# Patient Record
Sex: Male | Born: 1960
Health system: Southern US, Community
[De-identification: ages and names within clinical notes are randomized; demographics above are authoritative.]

## PROBLEM LIST (undated history)

## (undated) DIAGNOSIS — F419 Anxiety disorder, unspecified: Secondary | ICD-10-CM

## (undated) DIAGNOSIS — K219 Gastro-esophageal reflux disease without esophagitis: Secondary | ICD-10-CM

## (undated) DIAGNOSIS — J189 Pneumonia, unspecified organism: Secondary | ICD-10-CM

## (undated) DIAGNOSIS — T8859XA Other complications of anesthesia, initial encounter: Secondary | ICD-10-CM

## (undated) DIAGNOSIS — T4145XA Adverse effect of unspecified anesthetic, initial encounter: Secondary | ICD-10-CM

## (undated) DIAGNOSIS — M1990 Unspecified osteoarthritis, unspecified site: Secondary | ICD-10-CM

## (undated) HISTORY — PX: HERNIA REPAIR: SHX51

## (undated) HISTORY — PX: COLONOSCOPY: SHX174

## (undated) HISTORY — PX: NOSE SURGERY: SHX723

---

## 1998-05-18 ENCOUNTER — Ambulatory Visit: Admission: RE | Admit: 1998-05-18 | Discharge: 1998-05-18 | Payer: Self-pay | Admitting: Internal Medicine

## 2011-04-26 ENCOUNTER — Emergency Department (HOSPITAL_BASED_OUTPATIENT_CLINIC_OR_DEPARTMENT_OTHER)
Admission: EM | Admit: 2011-04-26 | Discharge: 2011-04-27 | Disposition: A | Discharge status: Home / Self Care | Payer: BC Managed Care – PPO | Attending: Emergency Medicine | Admitting: Emergency Medicine

## 2011-04-26 ENCOUNTER — Emergency Department (INDEPENDENT_AMBULATORY_CARE_PROVIDER_SITE_OTHER): Payer: BC Managed Care – PPO

## 2011-04-26 DIAGNOSIS — J189 Pneumonia, unspecified organism: Secondary | ICD-10-CM | POA: Insufficient documentation

## 2011-04-26 DIAGNOSIS — R509 Fever, unspecified: Secondary | ICD-10-CM

## 2011-04-27 LAB — DIFFERENTIAL
Basophils Absolute: 0 10*3/uL (ref 0.0–0.1)
Basophils Relative: 0 % (ref 0–1)
Eosinophils Absolute: 0.2 10*3/uL (ref 0.0–0.7)
Eosinophils Relative: 3 % (ref 0–5)
Monocytes Absolute: 0.8 10*3/uL (ref 0.1–1.0)
Monocytes Relative: 13 % — ABNORMAL HIGH (ref 3–12)

## 2011-04-27 LAB — BASIC METABOLIC PANEL
Calcium: 9.1 mg/dL (ref 8.4–10.5)
GFR calc Af Amer: >60 mL/min (ref >60)
GFR calc non Af Amer: >60 mL/min (ref >60)
Glucose, Bld: 120 mg/dL — ABNORMAL HIGH (ref 70–99)
Sodium: 133 mEq/L — ABNORMAL LOW (ref 135–145)

## 2011-04-27 LAB — CBC
MCH: 31.8 pg (ref 26.0–34.0)
MCHC: 35.4 g/dL (ref 30.0–36.0)
Platelets: 162 10*3/uL (ref 150–400)
RDW: 11.9 % (ref 11.5–15.5)

## 2011-04-28 ENCOUNTER — Emergency Department (INDEPENDENT_AMBULATORY_CARE_PROVIDER_SITE_OTHER): Payer: BC Managed Care – PPO

## 2011-04-28 ENCOUNTER — Emergency Department (HOSPITAL_BASED_OUTPATIENT_CLINIC_OR_DEPARTMENT_OTHER)
Admission: EM | Admit: 2011-04-28 | Discharge: 2011-04-28 | Disposition: A | Discharge status: Other Acute Inpatient Hospital | Payer: BC Managed Care – PPO | Source: Home / Self Care | Attending: Emergency Medicine | Admitting: Emergency Medicine

## 2011-04-28 ENCOUNTER — Inpatient Hospital Stay (HOSPITAL_COMMUNITY)
Admission: AD | Admit: 2011-04-28 | Discharge: 2011-04-30 | DRG: 090 | Disposition: A | Discharge status: Home / Self Care | Payer: BC Managed Care – PPO | Source: Other Acute Inpatient Hospital | Attending: Internal Medicine | Admitting: Internal Medicine

## 2011-04-28 DIAGNOSIS — J189 Pneumonia, unspecified organism: Secondary | ICD-10-CM

## 2011-04-28 DIAGNOSIS — R7309 Other abnormal glucose: Secondary | ICD-10-CM | POA: Diagnosis present

## 2011-04-28 DIAGNOSIS — R509 Fever, unspecified: Secondary | ICD-10-CM

## 2011-04-28 DIAGNOSIS — R05 Cough: Secondary | ICD-10-CM

## 2011-04-28 LAB — COMPREHENSIVE METABOLIC PANEL
Albumin: 2.5 g/dL — ABNORMAL LOW (ref 3.5–5.2)
BUN: 9 mg/dL (ref 6–23)
Creatinine, Ser: 1.05 mg/dL (ref 0.50–1.35)
Total Protein: 6.1 g/dL (ref 6.0–8.3)

## 2011-04-28 LAB — DIFFERENTIAL
Basophils Absolute: 0 10*3/uL (ref 0.0–0.1)
Eosinophils Absolute: 0.1 10*3/uL (ref 0.0–0.7)
Lymphocytes Relative: 12 % (ref 12–46)
Neutro Abs: 5 10*3/uL (ref 1.7–7.7)

## 2011-04-28 LAB — BASIC METABOLIC PANEL
CO2: 26 mEq/L (ref 19–32)
Calcium: 9.2 mg/dL (ref 8.4–10.5)
GFR calc non Af Amer: >60 mL/min (ref >60)
Sodium: 134 mEq/L — ABNORMAL LOW (ref 135–145)

## 2011-04-28 LAB — CBC
MCH: 31.7 pg (ref 26.0–34.0)
MCHC: 35.4 g/dL (ref 30.0–36.0)
Platelets: 201 10*3/uL (ref 150–400)
RBC: 4.23 MIL/uL (ref 4.22–5.81)

## 2011-04-28 LAB — BLOOD GAS, ARTERIAL
Drawn by: 308601
FIO2: 0.21 %
O2 Saturation: 92.9 %
Patient temperature: 98.6

## 2011-04-28 LAB — PHOSPHORUS: Phosphorus: 2.8 mg/dL (ref 2.3–4.6)

## 2011-04-28 LAB — MAGNESIUM: Magnesium: 2.1 mg/dL (ref 1.5–2.5)

## 2011-04-29 LAB — BASIC METABOLIC PANEL
Chloride: 101 mEq/L (ref 96–112)
Creatinine, Ser: 1.09 mg/dL (ref 0.50–1.35)
GFR calc Af Amer: >60 mL/min (ref >60)
Potassium: 3.7 mEq/L (ref 3.5–5.1)

## 2011-04-29 LAB — HIV ANTIBODY (ROUTINE TESTING W REFLEX): HIV: NONREACTIVE

## 2011-04-29 LAB — CBC
MCV: 90.5 fL (ref 78.0–100.0)
Platelets: 209 10*3/uL (ref 150–400)
RDW: 12.6 % (ref 11.5–15.5)
WBC: 6.1 10*3/uL (ref 4.0–10.5)

## 2011-04-29 LAB — HEMOGLOBIN A1C: Mean Plasma Glucose: 111 mg/dL (ref <117)

## 2011-04-30 LAB — BASIC METABOLIC PANEL
GFR calc Af Amer: >60 mL/min (ref >60)
GFR calc non Af Amer: >60 mL/min (ref >60)
Potassium: 3.9 mEq/L (ref 3.5–5.1)
Sodium: 136 mEq/L (ref 135–145)

## 2011-04-30 LAB — CBC
Hemoglobin: 13.3 g/dL (ref 13.0–17.0)
MCHC: 34.8 g/dL (ref 30.0–36.0)
Platelets: 256 10*3/uL (ref 150–400)
RDW: 12.9 % (ref 11.5–15.5)

## 2011-04-30 LAB — DIFFERENTIAL
Basophils Absolute: 0.1 10*3/uL (ref 0.0–0.1)
Eosinophils Absolute: 0.3 10*3/uL (ref 0.0–0.7)
Lymphs Abs: 1.2 10*3/uL (ref 0.7–4.0)
Monocytes Absolute: 0.8 10*3/uL (ref 0.1–1.0)
Monocytes Relative: 14 % — ABNORMAL HIGH (ref 3–12)

## 2011-05-24 NOTE — Discharge Summary (Signed)
NAME:  [name] [place], [name] NO.:  [id number]  MEDICAL RECORD NO.:  [id number]  LOCATION:  1340                         FACILITY:  [facility]  PHYSICIAN:  Ramiro Harvest, MD    DATE OF BIRTH:  [birth year]  DATE OF ADMISSION:  04/28/2011 DATE OF DISCHARGE:  04/30/2011                        DISCHARGE SUMMARY    PRIMARY CARE PHYSICIAN:  Gretta Arab. Valentina Lucks, MD, of [facility].  DISCHARGE DIAGNOSIS:  Community-acquired pneumonia.  DISCHARGE MEDICATIONS: 1. Avelox 400 mg p.o. daily x4 days. 2. Mucinex 1200 mg p.o. b.i.d. x4 days. 3. Robitussin syrup 5 mL p.o. q.4 h p.r.n. 4. Albuterol inhaler 2 puffs every 4 hours as needed. 5. Motrin 400 mg p.o. q.8 h p.r.n. 6. Multivitamin 1 tablet p.o. daily. 7. Tylenol Extra Strength 500 mg 2 tablets p.o. q.4 h p.r.n. pain.  DISPOSITION AND FOLLOWUP:  The patient will be discharged home.  The patient is to follow up with his PCP in the next 1 to 2 weeks to follow up on his community-acquired pneumonia.  CONSULTATIONS:  None.  PROCEDURES PERFORMED:  A chest x-ray was done on April 28, 2011, that showed slight worsening of left lower lobe pneumonia.  BRIEF ADMISSION HISTORY AND PHYSICAL:  [name] Shepherd is a 50 year old Caucasian gentleman with no significant past medical history who presented to the hospital after 1 week of general malaise, cough, nonproductive, and a high-grade fever with temps up to 103 to 104.  The patient had an x-ray done at the [facility] that demonstrated left lower lobe infiltrate consistent with a pneumonia.  The patient was started on a Z-Pak on the Saturday prior to admission; his symptoms started around the Friday around mid day prior to admission.  Despite being on antibiotics, the patient's symptoms worsened.  He continued to progressively get worse.  The patient went to the [facility], another repeat chest x-ray which was done showed slight worsening of his left lower  lobe infiltrate, was found to be hypoxic on exertion with O2 saturations dropping to the mid 80s.  He was subsequently transferred for further evaluation and treatment.  The patient was febrile with a temperature of 103 and tachycardic with heart rate of 115.  For the rest of admission history and physical, please see H and P dictated by Dr. Gwenlyn Perking of job number [id number].  HOSPITAL COURSE:  Left lower lobe community-acquired pneumonia:  The patient was admitted with a left lower lobe community-acquired pneumonia.  Urine Legionella and pneumococcus antigen were checked; they were pending at the time of discharge.  Blood cultures were also checked, however, were also pending.  The patient was monitored.  The patient improved clinically during this hospitalization and ABG which was done had a pH of 7.46, pCO2 of 38 and pO2 of 61, bicarb level of 27. The patient was placed on Mucinex, oxygen, nebulizer treatments as needed, as well as IV Avelox.  HIV antibody which was tested was nonreactive.  The patient improved clinically.  His cough improved and the patient remained afebrile about 36 hours during this hospitalization.  The patient's Mucinex was increased to 1200 mg daily. The patient improved clinically and will be discharged in stable  and improved condition on 4 more days of oral antibiotics of Avelox to complete a 1-week course of antibiotic therapy.  The patient will be discharged in stable and improved condition with followup with PCP as stated above.  On day of discharge, vital signs:  Temperature 97.9, pulse of 62, respirations 16, blood pressure 134/77, saturating 97% on room air.  DISCHARGE LABS:  CBC:  White count 5.8, hemoglobin 13.3, hematocrit 38.2 and a platelet count of 256, with an ANC of 3.4.  BMET with a sodium of 136, potassium 3.9, chloride 100, bicarb 29, glucose 103, BUN 9, creatinine 1.03, and a calcium of 9.3.  It has been a pleasure taking care of [name].     Ramiro Harvest, MD     DT/MEDQ  D:  04/30/2011  T:  04/30/2011  Job:  [id number]  cc:   Gretta Arab. Valentina Lucks, M.D. Fax: [phone]  Electronically Signed by Ramiro Harvest MD on 05/24/2011 06:23:57 PM

## 2011-05-26 NOTE — H&P (Signed)
NAME:  [name] [place], [name] Shepherd NO.:  [id number]  MEDICAL RECORD NO.:  [id number]  LOCATION:  1340                         FACILITY:  [facility]  PHYSICIAN:  Rosanna Randy, MDDATE OF BIRTH:  02-03-1961  DATE OF ADMISSION:  04/28/2011 DATE OF DISCHARGE:                             HISTORY & PHYSICAL   PRIMARY CARE PHYSICIAN:  Gretta Arab. Valentina Lucks, MD, with [facility].  CHIEF COMPLAINT:  Cough, chest discomfort, high-grade fever, general malaise.  HISTORY OF PRESENT ILLNESS:  [name] [place] is a 50 year old male without past medical history who came into the hospital after 1 week of general malaise, cough nonproductive, and high-grade fever.  He actually had his fever measured at home with values up to 103-104.  The patient had an x- ray done initially at [facility] that demonstrated left lower lobe infiltrate consistent with pneumonia and was started on Z-PAK. This was on Saturday.  Symptoms started around Friday, around midday. Despite being on the antibiotic, the patient's symptoms continued to progress and in fact continued to worsen.  The patient went to the [facility] and another chest x-ray demonstrated slight worsening of his left lower lobe infiltrate and was also found to be hypoxic on exertion with oxygen saturation dropping to the mid 80s.  At that moment, he was transferred for further evaluation and treatment.  The patient was also febrile with a temperature of 103 and tachycardic with a heart rate of 115.  ALLERGIES:  There is no known drug allergy.  PAST MEDICAL HISTORY:  There is no past medical history of importance.  MEDICATIONS:  Multivitamins over-the-counter.  SOCIAL HISTORY:  The patient denies tobacco and also illicit drugs.  He reports to drink 1 to 2 beers almost every day after work.  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  He is married, lives here in [name], has 2 children,  both of them are healthy.  FAMILY HISTORY:  Father with COPD and emphysema secondary to tobacco abuse.  Mother healthy.  REVIEW OF SYSTEMS:  Negative except as mentioned in HPI, otherwise.  PHYSICAL EXAMINATION:  VITAL SIGNS:  Temperature 98.6, heart rate 82, respiratory rate 20, blood pressure 129/72, oxygen saturation 95% on room air. GENERAL:  In general, the patient was lying in bed, coughing throughout the whole interview, warm to touch with his mucous membrane demonstrating mild dryness.  Due to the persistent cough, he was in mild distress, in general. HEENT:  Normocephalic.  No trauma.  Eyes:  Extraocular muscles intact. PERRLA.  No icterus and no nystagmus.  Ears and nose without any discharges.  No nasal congestion appreciated.  Mouth, no erythema or exudate.  Mild dry mucous membranes. NECK:  Supple.  No thyromegaly.  No bruits. RESPIRATORY SYSTEM:  Diffuse rhonchi.  Good air movement.  No wheezes. CARDIOVASCULAR:  Regular rate and rhythm.  No murmurs, gallops or rubs. ABDOMEN:  Soft, nontender, nondistended.  Positive bowel sounds. EXTREMITIES:  No cyanosis, clubbing or edema. SKIN:  No rashes or petechiae. NEUROLOGIC EXAM:  Alert, awake and oriented x3.  Cranial nerves II through XII intact.  Muscle strength 5/5.  Sensation intact to light  touch and pinprick. PSYCH:  Mood appropriate.  LABORATORY DATA:  Demonstrated a CBC with differential of 6.9, hemoglobin 13.4, platelets 201, MCV 89.4.  BMET with a sodium of 134, potassium 3.9, chloride 98, bicarb 26, blood sugar 129, BUN 9, creatinine 1.0, calcium 9.2.  X-ray demonstrated a slight worsening of the left lower lobe infiltrate in comparison to chest x-ray done on April 26, 2011.  ASSESSMENT AND PLAN.: 1. Fever, cough and acute respiratory distress with hypoxia on     exertion all most likely secondary to community-acquired pneumonia     due to the findings on his x-ray.  At this moment, we are going to     start  the patient on IV antibiotics using Avelox 400 mg every 24     hours.  We are going to check strep pneumo antigen in urine.  We     will also check a Legionella antigen in urine and due to the     hypoxia we are going to check an ABG; blood cultures, if the     patient spikes another fever.  Other concerns to explain for his     symptoms might be flu but at this moment he is out of the     therapeutic window since it has been more than 72 hours that he has     had the symptoms present.  So, we will hold on giving Tamiflu at     this moment.  We are going to provide fluid resuscitation and we     will go ahead and check HIV test.  We are going to use  Robitussin     and Mucinex to control his cough and we are going to use Tylenol     for fever control. 2. Hyperglycemia.  There is no history of diabetes.  We are going to     repeat another fasting blood sugar in the morning and we are going     to check a hemoglobin A1c. 3. DVT prophylaxis.  The patient is going to be started on Lovenox 40     mg subcutaneously q.24 h.  Further workup is going to be determined     on the patient's evaluation and results of these studies.     Rosanna Randy, MD     CEM/MEDQ  D:  04/28/2011  T:  04/28/2011  Job:  [id number]  cc:   Gretta Arab. Valentina Lucks, M.D. Fax: [phone]  Electronically Signed by Vassie Loll MD on 05/26/2011 04:20:55 PM

## 2012-08-20 IMAGING — CR DG CHEST 2V
2 series · 2 of 2 positions shown · non-contrast
Comparison: None

CLINICAL DATA: Fever over 101

CHEST - 2 VIEW

[w chest pa]
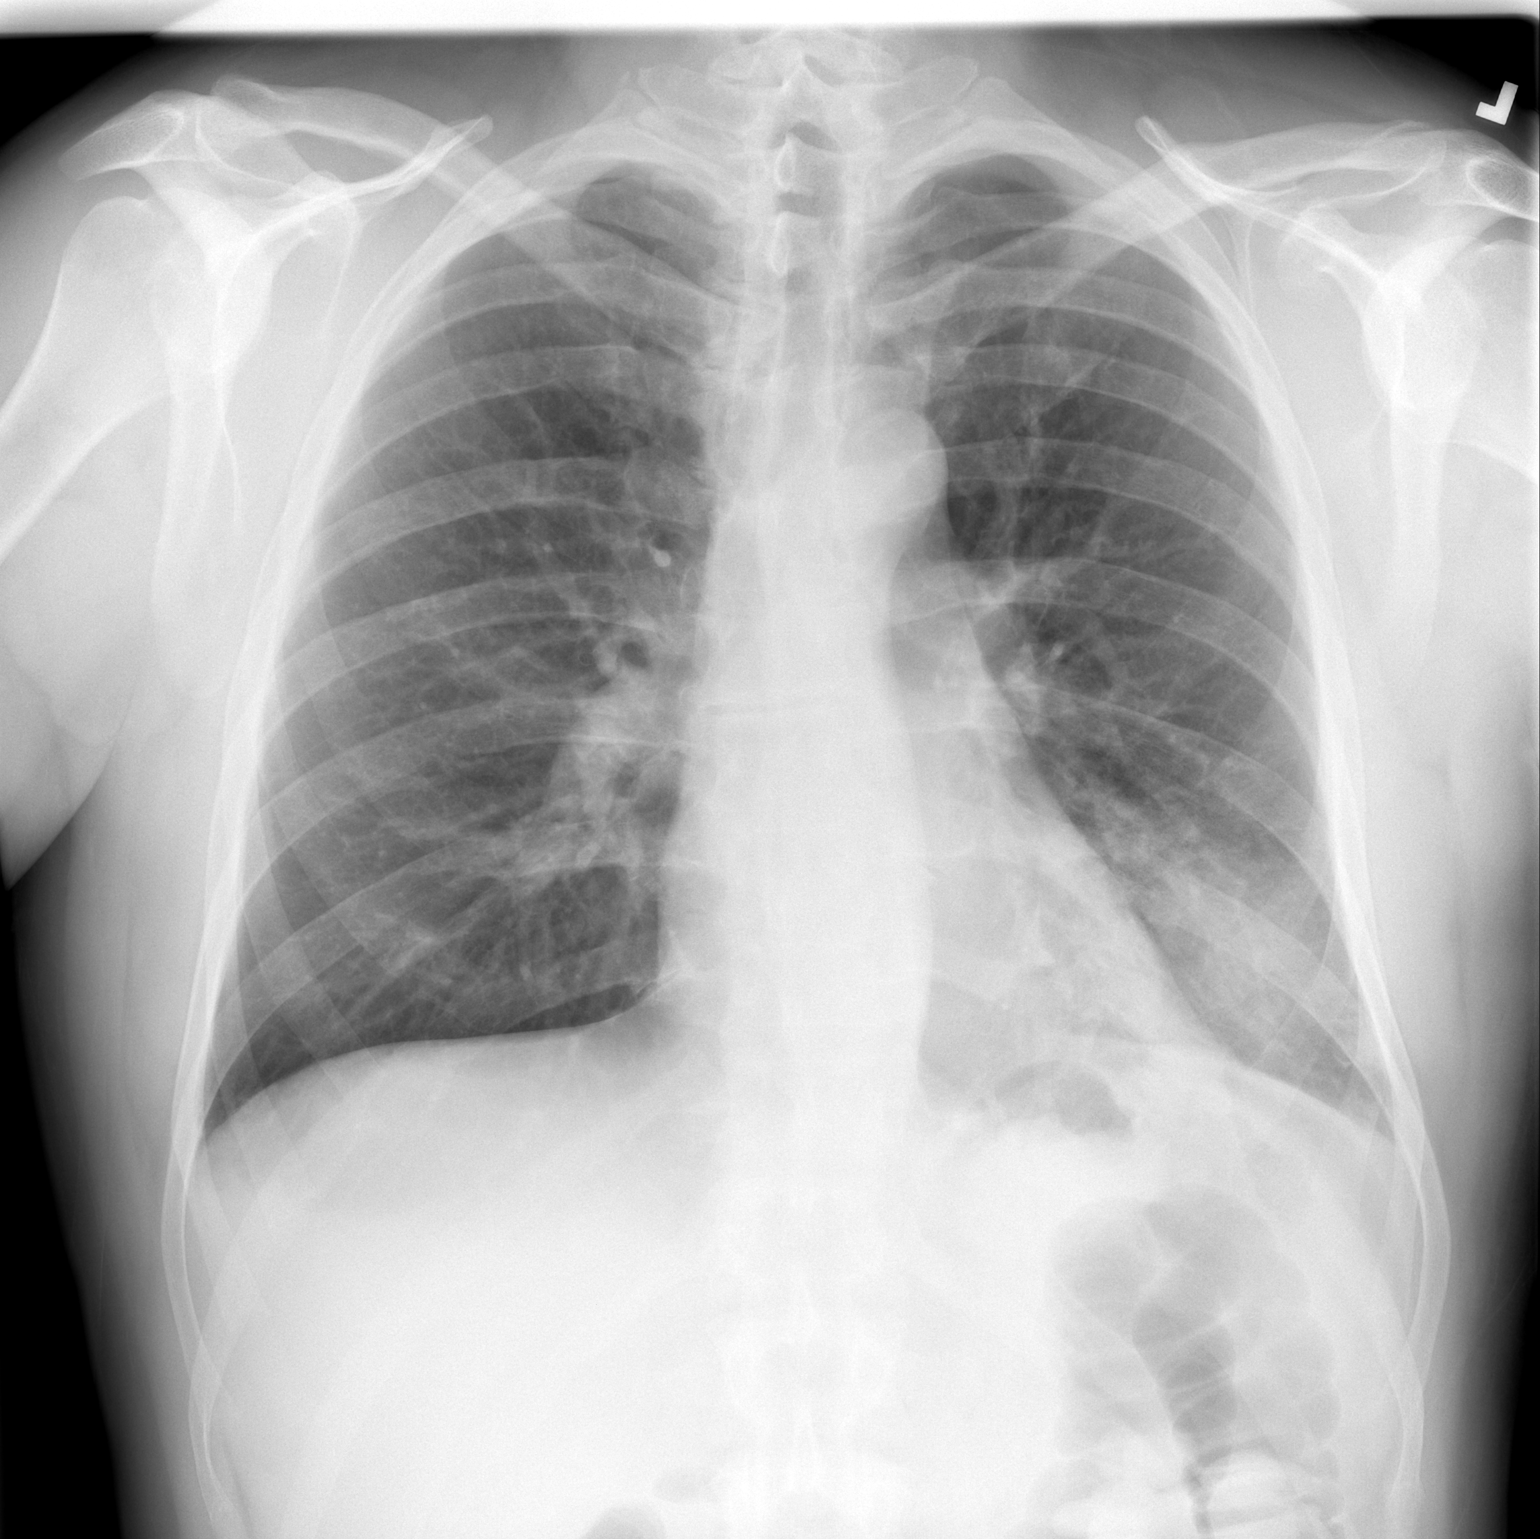

[w chest lat]
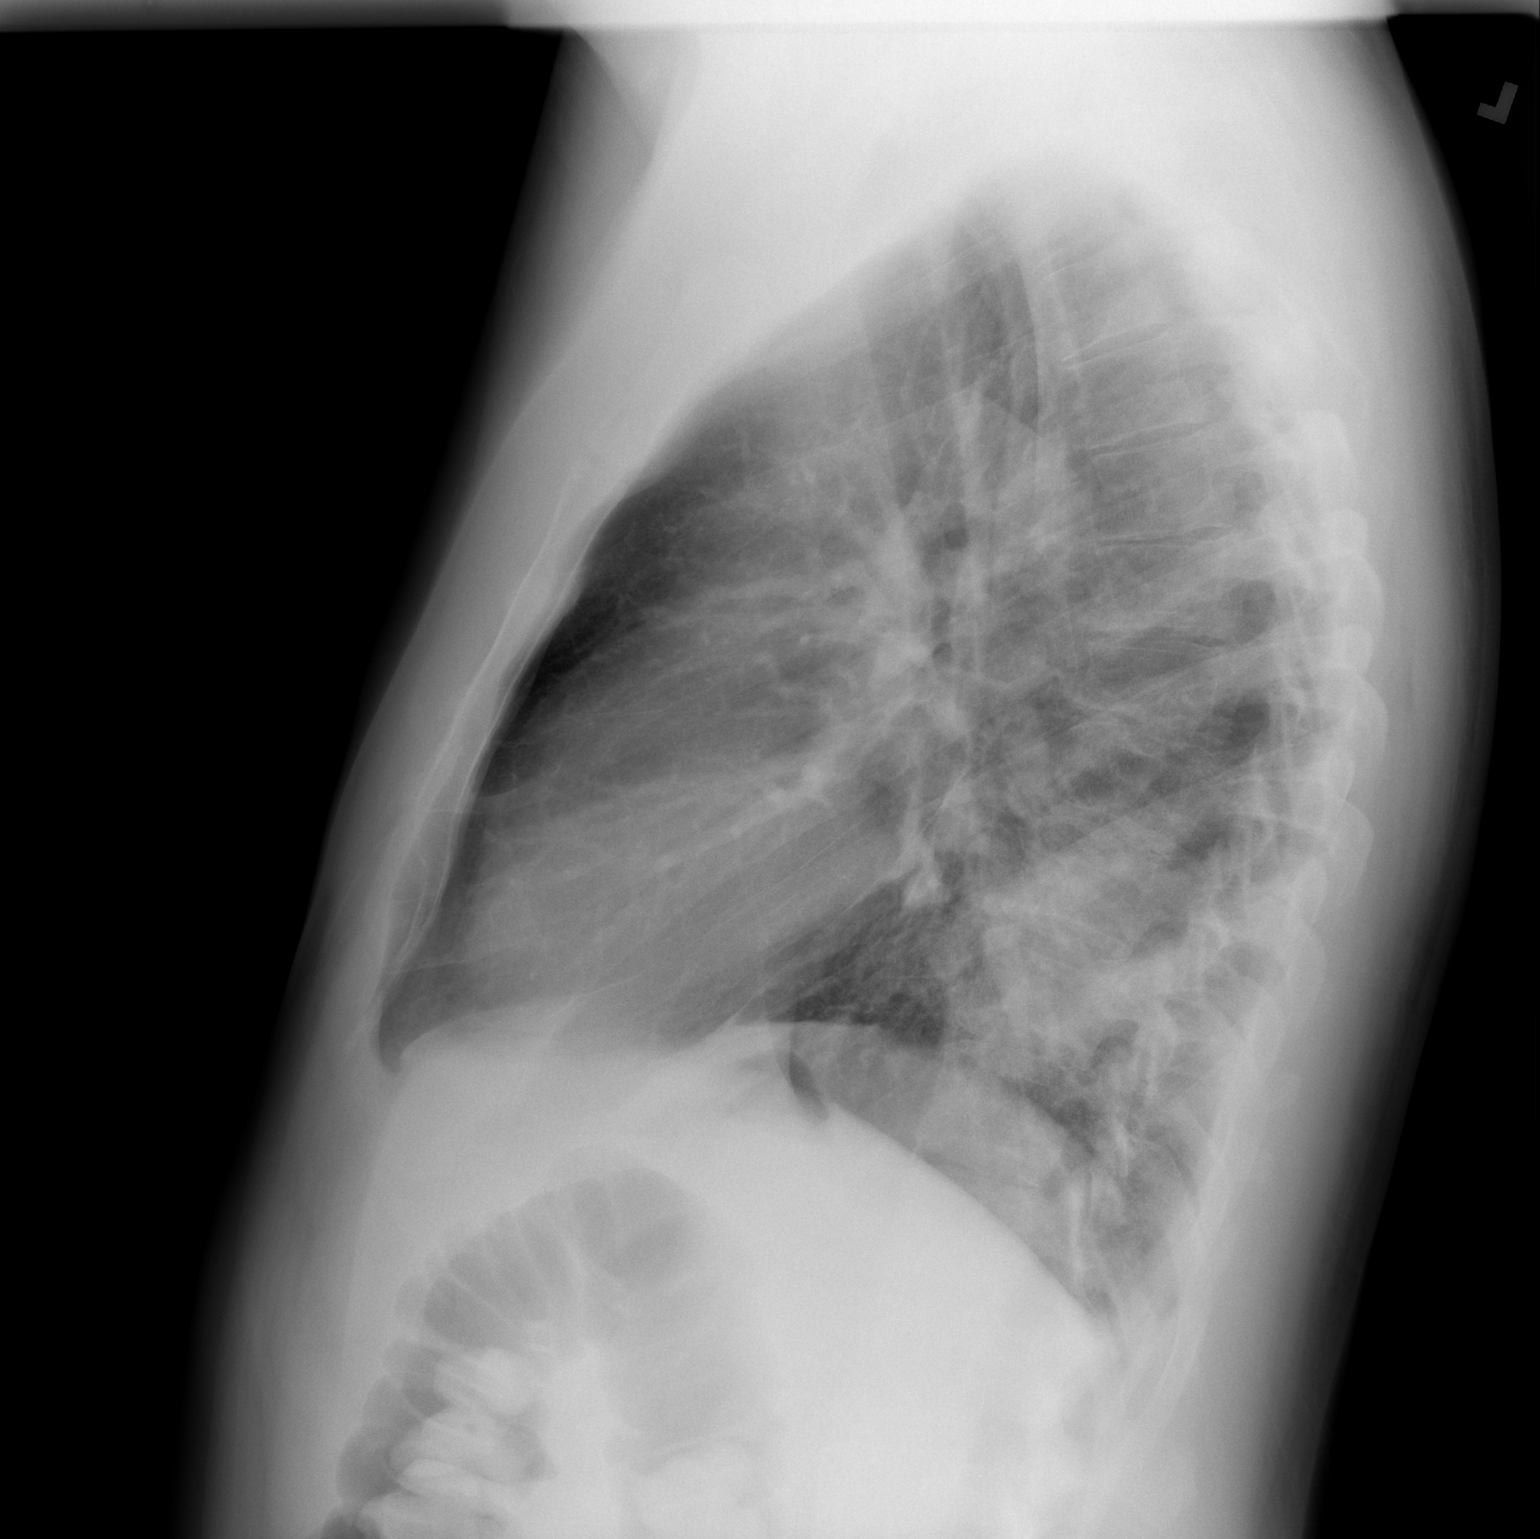

[2 of 2 positions shown; findings below may reference images not displayed]

FINDINGS: Heart size is normal.

No pleural effusion or pulmonary edema.

Airspace consolidation involving the left lower lobe is identified
compatible with pneumonia.
IMPRESSION: 1.  Left lower lobe pneumonia.

## 2012-08-22 IMAGING — CR DG CHEST 2V
2 series · 2 of 2 positions shown · non-contrast
Comparison: Chest x-ray of 04/26/2011

CLINICAL DATA: Cough, congestion, high fever

CHEST - 2 VIEW

[w chest pa]
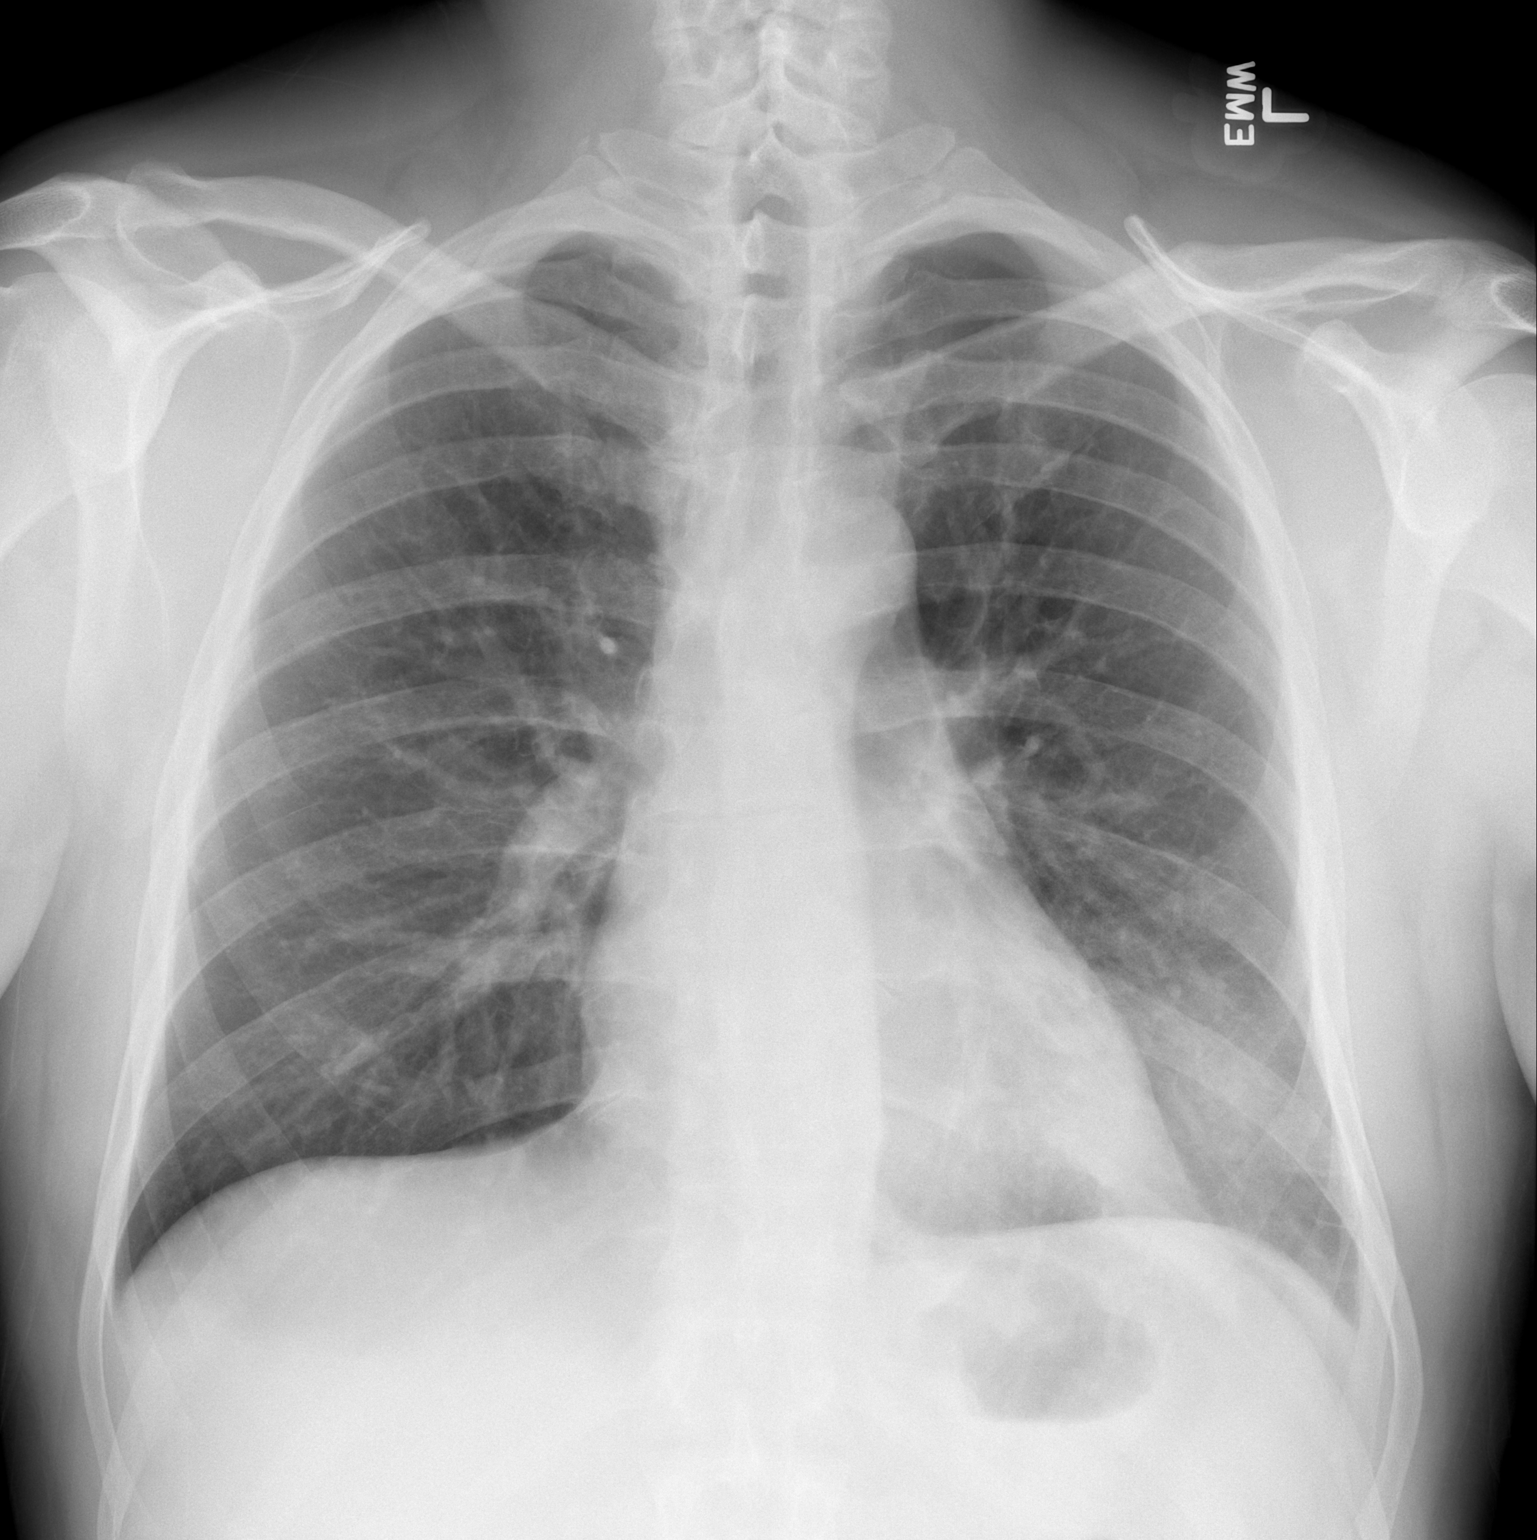

[w chest lat]
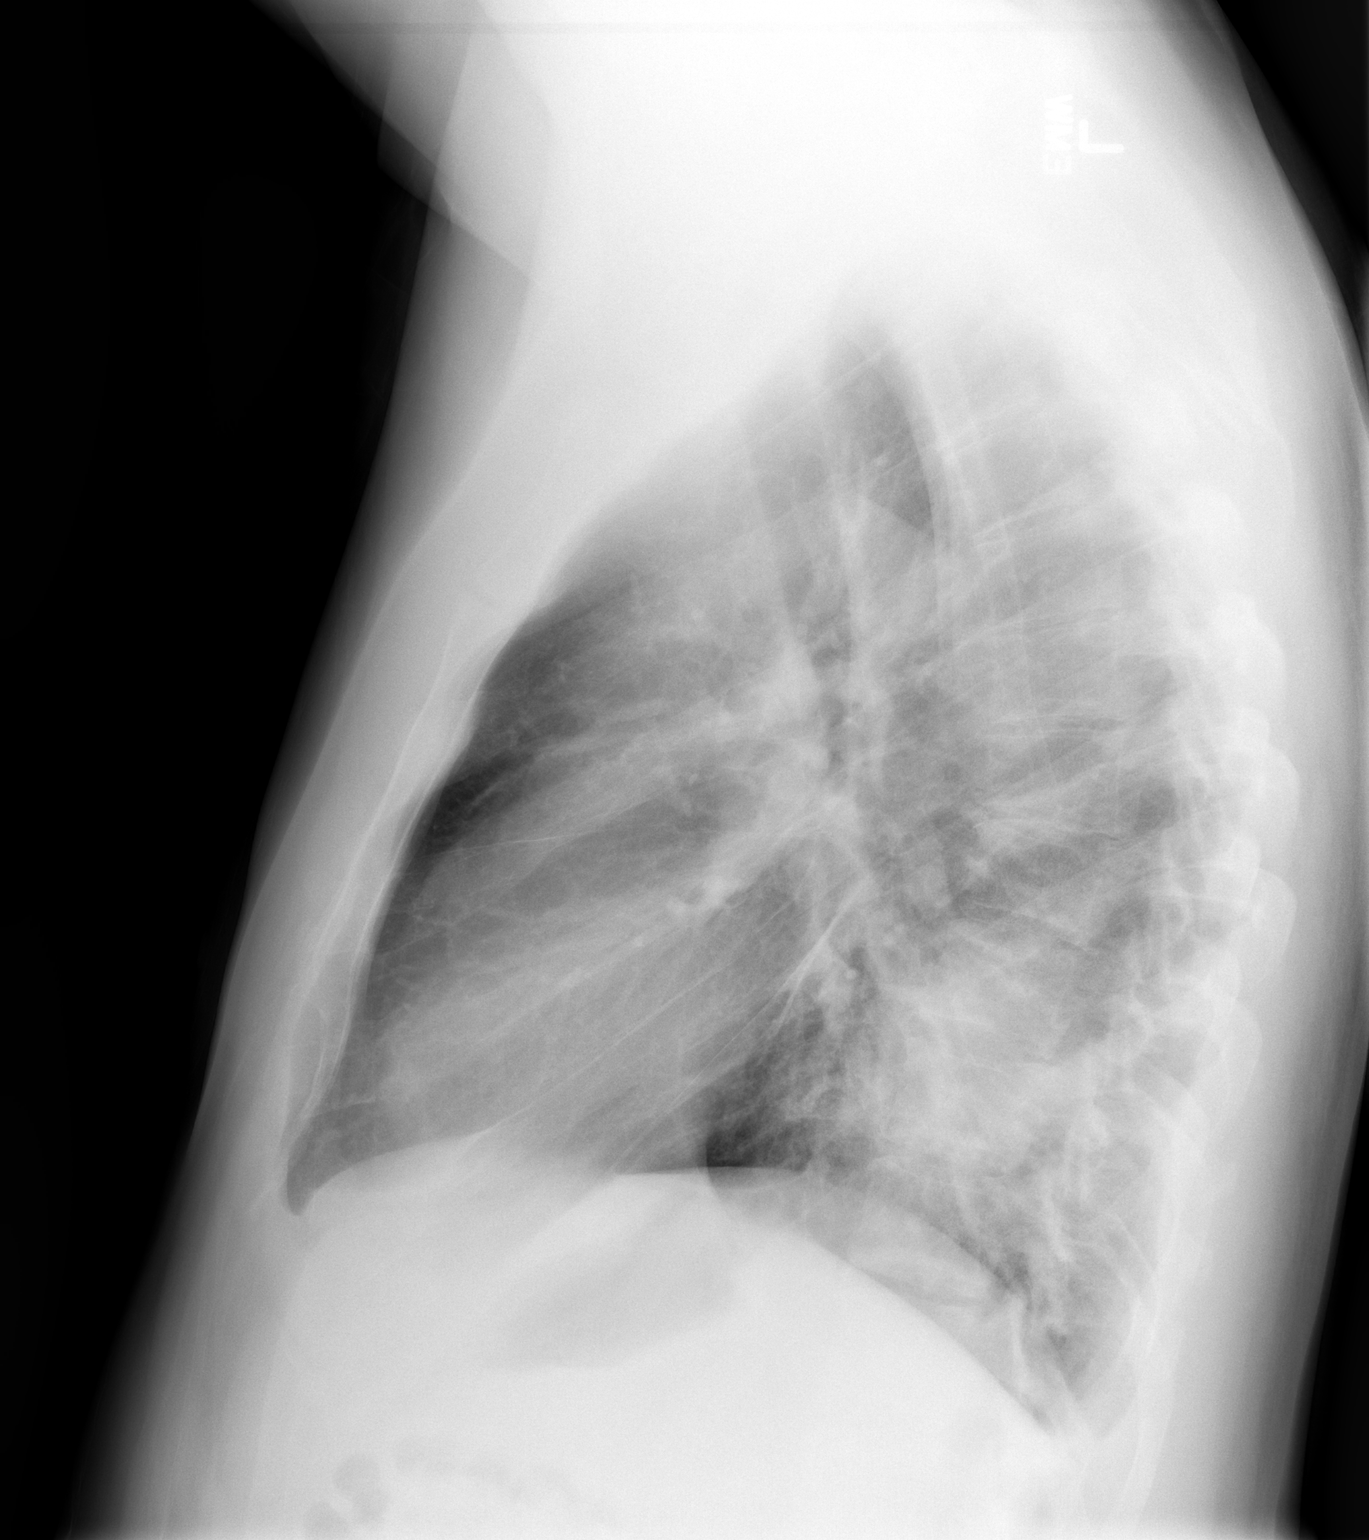

[2 of 2 positions shown; findings below may reference images not displayed]

FINDINGS: There is slightly more opacity within the left lower lobe
consistent with left lower lobe pneumonia.  No effusion is seen.
The right lung is clear.  Mediastinal contours are stable.  The
heart is within normal limits in size.  No bony abnormality is
seen.
IMPRESSION: Slight worsening of left lower lobe pneumonia.

## 2014-10-30 ENCOUNTER — Emergency Department (HOSPITAL_BASED_OUTPATIENT_CLINIC_OR_DEPARTMENT_OTHER)
Admission: EM | Admit: 2014-10-30 | Discharge: 2014-10-30 | Disposition: A | Discharge status: Home / Self Care | Payer: BC Managed Care – PPO | Attending: Emergency Medicine | Admitting: Emergency Medicine

## 2014-10-30 ENCOUNTER — Encounter (HOSPITAL_BASED_OUTPATIENT_CLINIC_OR_DEPARTMENT_OTHER): Payer: Self-pay | Admitting: *Deleted

## 2014-10-30 DIAGNOSIS — M5431 Sciatica, right side: Secondary | ICD-10-CM | POA: Diagnosis not present

## 2014-10-30 DIAGNOSIS — Z79899 Other long term (current) drug therapy: Secondary | ICD-10-CM | POA: Diagnosis not present

## 2014-10-30 DIAGNOSIS — Z87891 Personal history of nicotine dependence: Secondary | ICD-10-CM | POA: Diagnosis not present

## 2014-10-30 DIAGNOSIS — M5441 Lumbago with sciatica, right side: Secondary | ICD-10-CM

## 2014-10-30 DIAGNOSIS — G8929 Other chronic pain: Secondary | ICD-10-CM | POA: Diagnosis not present

## 2014-10-30 DIAGNOSIS — M545 Low back pain: Secondary | ICD-10-CM | POA: Diagnosis present

## 2014-10-30 MED ORDER — HYDROCODONE-ACETAMINOPHEN 5-325 MG PO TABS: 1.0000 | ORAL_TABLET | ORAL | Status: DC | PRN

## 2014-10-30 MED ORDER — PREDNISONE 20 MG PO TABS: 60.0000 mg | ORAL_TABLET | Freq: Every day | ORAL | Status: DC

## 2014-10-30 MED ORDER — METHOCARBAMOL 500 MG PO TABS: 500.0000 mg | ORAL_TABLET | Freq: Two times a day (BID) | ORAL | Status: DC

## 2014-10-30 NOTE — Discharge Instructions (Signed)
Followup with orthopedics if symptoms continue. Use conservative methods at home including heat therapy and cold therapy as we discussed. More information on cold therapy is listed below.  It is not recommended to use heat treatment directly after an acute injury. ° °SEEK IMMEDIATE MEDICAL ATTENTION IF: °New numbness, tingling, weakness, or problem with the use of your arms or legs.  °Severe back pain not relieved with medications.  °Change in bowel or bladder control.  °Increasing pain in any areas of the body (such as chest or abdominal pain).  °Shortness of breath, dizziness or fainting.  °Nausea (feeling sick to your stomach), vomiting, fever, or sweats. ° °COLD THERAPY DIRECTIONS:  °Ice or gel packs can be used to reduce both pain and swelling. Ice is the most helpful within the first 24 to 48 hours after an injury or flareup from overusing a muscle or joint.  Ice is effective, has very few side effects, and is safe for most people to use.  ° °If you expose your skin to cold temperatures for too long or without the proper protection, you can damage your skin or nerves. Watch for signs of skin damage due to cold.  ° °HOME CARE INSTRUCTIONS  °Follow these tips to use ice and cold packs safely.  °Place a dry or damp towel between the ice and skin. A damp towel will cool the skin more quickly, so you may need to shorten the time that the ice is used.  °For a more rapid response, add gentle compression to the ice.  °Ice for no more than 10 to 20 minutes at a time. The bonier the area you are icing, the less time it will take to get the benefits of ice.  °Check your skin after 5 minutes to make sure there are no signs of a poor response to cold or skin damage.  °Rest 20 minutes or more in between uses.  °Once your skin is numb, you can end your treatment. You can test numbness by very lightly touching your skin. The touch should be so light that you do not see the skin dimple from the pressure of your fingertip. When  using ice, most people will feel these normal sensations in this order: cold, burning, aching, and numbness.  °Do not use ice on someone who cannot communicate their responses to pain, such as small children or people with dementia.  ° °HOW TO MAKE AN ICE PACK  °To make an ice pack, do one of the following:  °Place crushed ice or a bag of frozen vegetables in a sealable plastic bag. Squeeze out the excess air. Place this bag inside another plastic bag. Slide the bag into a pillowcase or place a damp towel between your skin and the bag.  °Mix 3 parts water with 1 part rubbing alcohol. Freeze the mixture in a sealable plastic bag. When you remove the mixture from the freezer, it will be slushy. Squeeze out the excess air. Place this bag inside another plastic bag. Slide the bag into a pillowcase or place a damp towel between your skin and the bag.  ° °SEEK MEDICAL CARE IF:  °You develop white spots on your skin. This may give the skin a blotchy (mottled) appearance.  °Your skin turns blue or pale.  °Your skin becomes waxy or hard.  °Your swelling gets worse.  °MAKE SURE YOU:  °Understand these instructions.  °Will watch your condition.  °Will get help right away if you are not doing well or   get worse.  ° ° °Chronic Pain Discharge Instructions  °Emergency care providers appreciate that many patients coming to us are in severe pain and we wish to address their pain in the safest, most responsible manner.  It is important to recognize however, that the proper treatment of chronic pain differs from that of the pain of injuries and acute illnesses.  Our goal is to provide quality, safe, personalized care and we thank you for giving us the opportunity to serve you. °The use of narcotics and related agents for chronic pain syndromes may lead to additional physical and psychological problems.  Nearly as many people die from prescription narcotics each year as die from car crashes.  Additionally, this risk is increased if such  prescriptions are obtained from a variety of sources.  Therefore, only your primary care physician or a pain management specialist is able to safely treat such syndromes with narcotic medications long-term.   ° °Documentation revealing such prescriptions have been sought from multiple sources may prohibit us from providing a refill or different narcotic medication.  Your name may be checked first through the [place] Controlled Substances Reporting System.  This database is a record of controlled substance medication prescriptions that the patient has received.  This has been established by [place] in an effort to eliminate the dangerous, and often life threatening, practice of obtaining multiple prescriptions from different medical providers.  ° °If you have a chronic pain syndrome (i.e. chronic headaches, recurrent back or neck pain, dental pain, abdominal or pelvis pain without a specific diagnosis, or neuropathic pain such as fibromyalgia) or recurrent visits for the same condition without an acute diagnosis, you may be treated with non-narcotics and other non-addictive medicines.  Allergic reactions or negative side effects that may be reported by a patient to such medications will not typically lead to the use of a narcotic analgesic or other controlled substance as an alternative. °  °Patients managing chronic pain with a personal physician should have provisions in place for breakthrough pain.  If you are in crisis, you should call your physician.  If your physician directs you to the emergency department, please have the doctor call and speak to our attending physician concerning your care. °  °When patients come to the Emergency Department (ED) with acute medical conditions in which the Emergency Department physician feels appropriate to prescribe narcotic or sedating pain medication, the physician will prescribe these in very limited quantities.  The amount of these medications will last only  until you can see your primary care physician in his/her office.  Any patient who returns to the ED seeking refills should expect only non-narcotic pain medications.  ° °In the event of an acute medical condition exists and the emergency physician feels it is necessary that the patient be given a narcotic or sedating medication -  a responsible adult driver should be present in the room prior to the medication being given by the nurse. °  °Prescriptions for narcotic or sedating medications that have been lost, stolen or expired will not be refilled in the Emergency Department.   ° °Patients who have chronic pain may receive non-narcotic prescriptions until seen by their primary care physician.  It is every patient’s personal responsibility to maintain active prescriptions with his or her primary care physician or specialist. ° ° ° °

## 2014-10-30 NOTE — ED Provider Notes (Signed)
CSN: [id number]     Arrival date & time 10/30/14  1340 History   First MD Initiated Contact with Patient 10/30/14 1500     Chief Complaint  Patient presents with  . Back Pain     (Consider location/radiation/quality/duration/timing/severity/associated sxs/prior Treatment) HPI Comments: Patient presents today with lower back pain.  He reports that he has chronic back pain, but worsened over the past three days.  No acute injury or trauma.  Pain radiates down the right leg.  He denies numbness, tingling, fever, chills, abdominal pain, weakness, abdominal pain, or bowel/bladder incontinence.  He has taken Ibuprofen for the pain without improvement.  Pain worse with movement.  No history of IVDU or Cancer.    Patient is a 53 y.o. male presenting with back pain. The history is provided by the patient.  Back Pain Location:  Lumbar spine   History reviewed. No pertinent past medical history. Past Surgical History  Procedure Laterality Date  . Nose surgery    . Hernia repair     No family history on file. History  Substance Use Topics  . Smoking status: Former Games developermoker  . Smokeless tobacco: Never Used  . Alcohol Use: Yes     Comment: 2-3 drinks/day    Review of Systems  Musculoskeletal: Positive for back pain.  All other systems reviewed and are negative.     Allergies  Review of patient's allergies indicates no known allergies.  Home Medications   Prior to Admission medications   Medication Sig Start Date End Date Taking? Authorizing Provider  gemfibrozil (LOPID) 600 MG tablet Take 600 mg by mouth 2 (two) times daily before a meal.   Yes Historical Provider, MD  ibuprofen (ADVIL,MOTRIN) 200 MG tablet Take 800 mg by mouth every 6 (six) hours as needed.   Yes Historical Provider, MD   BP 170/87 mmHg  Pulse 70  Temp(Src) 98.4 F (36.9 C) (Oral)  Resp 16  Ht 6\' 4"  (1.93 m)  Wt 230 lb (104.327 kg)  BMI 28.01 kg/m2  SpO2 98% Physical Exam  Constitutional: He appears  well-developed and well-nourished.  HENT:  Head: Normocephalic and atraumatic.  Neck: Normal range of motion. Neck supple.  Cardiovascular: Normal rate, regular rhythm and normal heart sounds.   Pulmonary/Chest: Effort normal and breath sounds normal.  Musculoskeletal: Normal range of motion.       Cervical back: He exhibits normal range of motion, no tenderness, no bony tenderness, no swelling, no edema and no deformity.       Thoracic back: He exhibits normal range of motion, no tenderness, no bony tenderness, no swelling, no edema and no deformity.       Lumbar back: He exhibits tenderness and bony tenderness. He exhibits normal range of motion, no swelling, no edema and no deformity.  Neurological: He is alert. He has normal strength. No sensory deficit.  Reflex Scores:      Patellar reflexes are 2+ on the right side and 2+ on the left side.      Achilles reflexes are 2+ on the right side and 2+ on the left side. Stiff gait, but able to ambulate without difficulty  Skin: Skin is warm and dry. No erythema.  Psychiatric: He has a normal mood and affect.  Nursing note and vitals reviewed.   ED Course  Procedures (including critical care time) Labs Review Labs Reviewed - No data to display  Imaging Review No results found.   EKG Interpretation None      MDM  Final diagnoses:  None   Patient with back pain.  No neurological deficits and normal neuro exam.  Patient can walk but states is painful.  No loss of bowel or bladder control.  No concern for cauda equina.  No fever, night sweats, weight loss, h/o cancer, IVDU.  RICE protocol and pain medicine indicated and discussed with patient.  Patient stable for discharge.  Return precautions given.          Santiago GladHeather Nazirah Tri, PA-C 10/30/14 2337  Ethelda ChickMartha K Linker, MD 10/30/14 [phone]

## 2014-10-30 NOTE — ED Notes (Signed)
Hx of back pain- worse x 3 days- has appt with ortho on Jan 5

## 2014-12-01 ENCOUNTER — Other Ambulatory Visit: Payer: Self-pay | Admitting: Orthopedic Surgery

## 2014-12-09 ENCOUNTER — Encounter (HOSPITAL_COMMUNITY): Payer: Self-pay

## 2014-12-09 ENCOUNTER — Other Ambulatory Visit (HOSPITAL_COMMUNITY): Payer: Self-pay | Admitting: *Deleted

## 2014-12-09 ENCOUNTER — Encounter (HOSPITAL_COMMUNITY)
Admission: RE | Admit: 2014-12-09 | Discharge: 2014-12-09 | Disposition: A | Discharge status: Home / Self Care | Payer: BLUE CROSS/BLUE SHIELD | Source: Ambulatory Visit | Attending: Orthopedic Surgery | Admitting: Orthopedic Surgery

## 2014-12-09 ENCOUNTER — Ambulatory Visit (HOSPITAL_COMMUNITY)
Admission: RE | Admit: 2014-12-09 | Discharge: 2014-12-09 | Disposition: A | Discharge status: Home / Self Care | Payer: BLUE CROSS/BLUE SHIELD | Source: Ambulatory Visit | Attending: Orthopedic Surgery | Admitting: Orthopedic Surgery

## 2014-12-09 DIAGNOSIS — Z01810 Encounter for preprocedural cardiovascular examination: Secondary | ICD-10-CM | POA: Insufficient documentation

## 2014-12-09 DIAGNOSIS — Z01812 Encounter for preprocedural laboratory examination: Secondary | ICD-10-CM | POA: Diagnosis not present

## 2014-12-09 DIAGNOSIS — Z01818 Encounter for other preprocedural examination: Secondary | ICD-10-CM | POA: Diagnosis present

## 2014-12-09 DIAGNOSIS — M5126 Other intervertebral disc displacement, lumbar region: Secondary | ICD-10-CM | POA: Diagnosis not present

## 2014-12-09 HISTORY — DX: Gastro-esophageal reflux disease without esophagitis: K21.9

## 2014-12-09 HISTORY — DX: Anxiety disorder, unspecified: F41.9

## 2014-12-09 HISTORY — DX: Adverse effect of unspecified anesthetic, initial encounter: T41.45XA

## 2014-12-09 HISTORY — DX: Unspecified osteoarthritis, unspecified site: M19.90

## 2014-12-09 HISTORY — DX: Pneumonia, unspecified organism: J18.9

## 2014-12-09 HISTORY — DX: Other complications of anesthesia, initial encounter: T88.59XA

## 2014-12-09 LAB — APTT: aPTT: 36 seconds (ref 24–37)

## 2014-12-09 LAB — CBC WITH DIFFERENTIAL/PLATELET
BASOS PCT: 1 % (ref 0–1)
Basophils Absolute: 0.1 10*3/uL (ref 0.0–0.1)
Eosinophils Absolute: 0.2 10*3/uL (ref 0.0–0.7)
Eosinophils Relative: 3 % (ref 0–5)
HCT: 41.4 % (ref 39.0–52.0)
HEMOGLOBIN: 14.7 g/dL (ref 13.0–17.0)
LYMPHS PCT: 33 % (ref 12–46)
Lymphs Abs: 2.1 10*3/uL (ref 0.7–4.0)
MCH: 31.8 pg (ref 26.0–34.0)
MCHC: 35.5 g/dL (ref 30.0–36.0)
MCV: 89.6 fL (ref 78.0–100.0)
Monocytes Absolute: 0.8 10*3/uL (ref 0.1–1.0)
Monocytes Relative: 12 % (ref 3–12)
NEUTROS ABS: 3.3 10*3/uL (ref 1.7–7.7)
Neutrophils Relative %: 51 % (ref 43–77)
PLATELETS: 248 10*3/uL (ref 150–400)
RBC: 4.62 MIL/uL (ref 4.22–5.81)
RDW: 12.8 % (ref 11.5–15.5)
WBC: 6.5 10*3/uL (ref 4.0–10.5)

## 2014-12-09 LAB — COMPREHENSIVE METABOLIC PANEL
ALBUMIN: 4.4 g/dL (ref 3.5–5.2)
ALK PHOS: 64 U/L (ref 39–117)
ALT: 28 U/L (ref 0–53)
ANION GAP: 9 (ref 5–15)
AST: 26 U/L (ref 0–37)
BILIRUBIN TOTAL: 0.6 mg/dL (ref 0.3–1.2)
BUN: 16 mg/dL (ref 6–23)
CALCIUM: 9.8 mg/dL (ref 8.4–10.5)
CO2: 29 mmol/L (ref 19–32)
Chloride: 101 mmol/L (ref 96–112)
Creatinine, Ser: 1.15 mg/dL (ref 0.50–1.35)
GFR, EST AFRICAN AMERICAN: 81 mL/min — AB (ref >90)
GFR, EST NON AFRICAN AMERICAN: 70 mL/min — AB (ref >90)
GLUCOSE: 93 mg/dL (ref 70–99)
Potassium: 4.1 mmol/L (ref 3.5–5.1)
Sodium: 139 mmol/L (ref 135–145)
Total Protein: 6.9 g/dL (ref 6.0–8.3)

## 2014-12-09 LAB — URINALYSIS, ROUTINE W REFLEX MICROSCOPIC
Bilirubin Urine: NEGATIVE
Glucose, UA: NEGATIVE mg/dL
HGB URINE DIPSTICK: NEGATIVE
Ketones, ur: NEGATIVE mg/dL
LEUKOCYTES UA: NEGATIVE
Nitrite: NEGATIVE
PROTEIN: NEGATIVE mg/dL
SPECIFIC GRAVITY, URINE: 1.013 (ref 1.005–1.030)
Urobilinogen, UA: 0.2 mg/dL (ref 0.0–1.0)
pH: 6.5 (ref 5.0–8.0)

## 2014-12-09 LAB — PROTIME-INR
INR: 1.03 (ref 0.00–1.49)
Prothrombin Time: 13.6 seconds (ref 11.6–15.2)

## 2014-12-09 LAB — TYPE AND SCREEN
ABO/RH(D): O POS
ANTIBODY SCREEN: NEGATIVE

## 2014-12-09 LAB — ABO/RH: ABO/RH(D): O POS

## 2014-12-09 LAB — SURGICAL PCR SCREEN
MRSA, PCR: NEGATIVE
Staphylococcus aureus: NEGATIVE

## 2014-12-09 NOTE — Pre-Procedure Instructions (Signed)
[name] Shepherd  12/09/2014   Your procedure is scheduled on:  Thursday, December 17, 2014 at 11:45 AM.   Report to [facility] Entrance "A" Admitting Office at 9:45 AM.   Call this number if you have problems the morning of surgery: [phone]               Any questions prior to day of surgery, please call [phone] between 8 & 4 PM.   Remember:   Do not eat food or drink liquids after midnight Wednesday, 12/16/14.   Take these medicines the morning of surgery with A SIP OF WATER: Oxycodone - if needed  Stop Vitamins as of tomorrow. Don't use Ibuprofen prior to surgery.    Do not wear jewelry  Do not wear lotions, powders, or cologne. You may wear deodorant.  Men may shave face and neck.  Do not bring valuables to the hospital.  [facility] is not responsible                  for any belongings or valuables.               Contacts, dentures or bridgework may not be worn into surgery.  Leave suitcase in the car. After surgery it may be brought to your room.  For patients admitted to the hospital, discharge time is determined by your                treatment team.               Patients discharged the day of surgery will not be allowed to drive home.    Special Instructions: [place] - Preparing for Surgery  Before surgery, you can play an important role.  Because skin is not sterile, your skin needs to be as free of germs as possible.  You can reduce the number of germs on you skin by washing with CHG (chlorahexidine gluconate) soap before surgery.  CHG is an antiseptic cleaner which kills germs and bonds with the skin to continue killing germs even after washing.  Please DO NOT use if you have an allergy to CHG or antibacterial soaps.  If your skin becomes reddened/irritated stop using the CHG and inform your nurse when you arrive at Short Stay.  Do not shave (including legs and underarms) for at least 48 hours prior to the first CHG shower.  You may shave your  face.  Please follow these instructions carefully:   1.  Shower with CHG Soap the night before surgery and the                                morning of Surgery.  2.  If you choose to wash your hair, wash your hair first as usual with your       normal shampoo.  3.  After you shampoo, rinse your hair and body thoroughly to remove the                      Shampoo.  4.  Use CHG as you would any other liquid soap.  You can apply chg directly       to the skin and wash gently with scrungie or a clean washcloth.  5.  Apply the CHG Soap to your body ONLY FROM THE NECK DOWN.        Do not use on open  wounds or open sores.  Avoid contact with your eyes, ears, mouth and genitals (private parts).  Wash genitals (private parts) with your normal soap.  6.  Wash thoroughly, paying special attention to the area where your surgery        will be performed.  7.  Thoroughly rinse your body with warm water from the neck down.  8.  DO NOT shower/wash with your normal soap after using and rinsing off       the CHG Soap.  9.  Pat yourself dry with a clean towel.            10.  Wear clean pajamas.            11.  Place clean sheets on your bed the night of your first shower and do not        sleep with pets.  Day of Surgery  Do not apply any lotions the morning of surgery.  Please wear clean clothes to the hospital.     Please read over the following fact sheets that you were given: Pain Booklet, Coughing and Deep Breathing, Blood Transfusion Information, MRSA Information and Surgical Site Infection Prevention

## 2014-12-09 NOTE — Progress Notes (Signed)
   12/09/14 1518  OBSTRUCTIVE SLEEP APNEA  Have you ever been diagnosed with sleep apnea through a sleep study? No  Do you snore loudly (loud enough to be heard through closed doors)?  1  Do you often feel tired, fatigued, or sleepy during the daytime? 0  Has anyone observed you stop breathing during your sleep? 1  Do you have, or are you being treated for high blood pressure? 0  BMI more than 35 kg/m2? 0  Age over 55 years old? 1  Neck circumference greater than 40 cm/16 inches? 1  Gender: 1  Obstructive Sleep Apnea Score 5  Score 4 or greater  Results sent to PCP

## 2014-12-16 MED ORDER — CEFAZOLIN SODIUM-DEXTROSE 2-3 GM-% IV SOLR
2.0000 g | INTRAVENOUS | Status: AC
  Administered 2014-12-17: 2 g via INTRAVENOUS
  Filled 2014-12-16: qty 50

## 2014-12-16 NOTE — H&P (Signed)
PREOPERATIVE H&P  Chief Complaint: Right leg pain  HPI: [name] Shepherd is a 54 y.o. male who presents with ongoing pain in the right leg  MRI reveals a foraminal/extraforaminal HNP on the right at L4/5, compressing the right L4 nerve  Patient has failed multiple forms of conservative care and continues to have pain (see office notes for additional details regarding the patient's full course of treatment)  Past Medical History  Diagnosis Date  . Pneumonia   . Anxiety   . GERD (gastroesophageal reflux disease)     occasional  . Arthritis   . Complication of anesthesia     some difficulty waking up after nasal surgery ("years ago")   Past Surgical History  Procedure Laterality Date  . Nose surgery      polyps removed  . Hernia repair Right     inguinal  . Colonoscopy     History   Social History  . Marital Status: Married    Spouse Name: N/A  . Number of Children: N/A  . Years of Education: N/A   Social History Main Topics  . Smoking status: Former Smoker    Quit date: 12/09/1981  . Smokeless tobacco: Never Used  . Alcohol Use: 8.4 oz/week    14 Glasses of wine per week     Comment: 2-3 drinks/day  . Drug Use: No  . Sexual Activity: Not on file   Other Topics Concern  . Not on file   Social History Narrative   Family History  Problem Relation Age of Onset  . Alcoholism Father   . Kidney failure Father    No Known Allergies Prior to Admission medications   Medication Sig Start Date End Date Taking? Authorizing Provider  diazepam (VALIUM) 5 MG tablet Take 5 mg by mouth See admin instructions. Take 1 tablet (5 mg) one hour before MRI and another tablet 1/2 hour before MRI 11/03/14  Yes Historical Provider, MD  gemfibrozil (LOPID) 600 MG tablet Take 600 mg by mouth 2 (two) times daily before a meal.   Yes Historical Provider, MD  Multiple Vitamin (MULTIVITAMIN WITH MINERALS) TABS tablet Take 1 tablet by mouth daily.   Yes Historical Provider, MD    oxyCODONE-acetaminophen (PERCOCET/ROXICET) 5-325 MG per tablet Take 1-2 tablets by mouth at bedtime as needed (pain).  11/10/14  Yes Historical Provider, MD  HYDROcodone-acetaminophen (NORCO/VICODIN) 5-325 MG per tablet Take 1-2 tablets by mouth every 4 (four) hours as needed. Patient not taking: Reported on 12/09/2014 10/30/14   Santiago Glad, PA-C  methocarbamol (ROBAXIN) 500 MG tablet Take 1 tablet (500 mg total) by mouth 2 (two) times daily. Patient not taking: Reported on 12/09/2014 10/30/14   Santiago Glad, PA-C  predniSONE (DELTASONE) 20 MG tablet Take 3 tablets (60 mg total) by mouth daily. Patient not taking: Reported on 12/09/2014 10/30/14   Santiago Glad, PA-C     All other systems have been reviewed and were otherwise negative with the exception of those mentioned in the HPI and as above.  Physical Exam: There were no vitals filed for this visit.  General: Alert, no acute distress Cardiovascular: No pedal edema Respiratory: No cyanosis, no use of accessory musculature Skin: No lesions in the area of chief complaint Neurologic: Sensation intact distally Psychiatric: Patient is competent for consent with normal mood and affect Lymphatic: No axillary or cervical lymphadenopathy  MUSCULOSKELETAL: + SLR on right  Assessment/Plan: Right leg pain Plan for Procedure(s): LUMBAR LAMINECTOMY/DECOMPRESSION L4/5 on right   Yaqueline Gutter  Darcel BayleyLEONARD, MD 12/16/2014 7:02 PM

## 2014-12-17 ENCOUNTER — Ambulatory Visit (HOSPITAL_COMMUNITY): Payer: BLUE CROSS/BLUE SHIELD | Admitting: Anesthesiology

## 2014-12-17 ENCOUNTER — Ambulatory Visit (HOSPITAL_COMMUNITY): Payer: BLUE CROSS/BLUE SHIELD

## 2014-12-17 ENCOUNTER — Ambulatory Visit (HOSPITAL_COMMUNITY)
Admission: RE | Admit: 2014-12-17 | Discharge: 2014-12-17 | Disposition: A | Discharge status: Home / Self Care | Payer: BLUE CROSS/BLUE SHIELD | Source: Ambulatory Visit | Attending: Orthopedic Surgery | Admitting: Orthopedic Surgery

## 2014-12-17 ENCOUNTER — Encounter (HOSPITAL_COMMUNITY)
Admission: RE | Disposition: A | Discharge status: Home / Self Care | Payer: Self-pay | Source: Ambulatory Visit | Attending: Orthopedic Surgery

## 2014-12-17 ENCOUNTER — Encounter (HOSPITAL_COMMUNITY): Payer: Self-pay | Admitting: Anesthesiology

## 2014-12-17 DIAGNOSIS — Z87891 Personal history of nicotine dependence: Secondary | ICD-10-CM | POA: Diagnosis not present

## 2014-12-17 DIAGNOSIS — M5126 Other intervertebral disc displacement, lumbar region: Secondary | ICD-10-CM | POA: Diagnosis not present

## 2014-12-17 DIAGNOSIS — M1990 Unspecified osteoarthritis, unspecified site: Secondary | ICD-10-CM | POA: Insufficient documentation

## 2014-12-17 DIAGNOSIS — K219 Gastro-esophageal reflux disease without esophagitis: Secondary | ICD-10-CM | POA: Diagnosis not present

## 2014-12-17 DIAGNOSIS — M5410 Radiculopathy, site unspecified: Secondary | ICD-10-CM

## 2014-12-17 HISTORY — PX: LUMBAR LAMINECTOMY/DECOMPRESSION MICRODISCECTOMY: SHX5026

## 2014-12-17 SURGERY — LUMBAR LAMINECTOMY/DECOMPRESSION MICRODISCECTOMY
Anesthesia: General

## 2014-12-17 MED ORDER — FENTANYL CITRATE 0.05 MG/ML IJ SOLN
INTRAMUSCULAR | Status: AC
  Filled 2014-12-17: qty 5

## 2014-12-17 MED ORDER — PROPOFOL 10 MG/ML IV BOLUS
INTRAVENOUS | Status: AC
  Filled 2014-12-17: qty 20

## 2014-12-17 MED ORDER — DEXAMETHASONE SODIUM PHOSPHATE 4 MG/ML IJ SOLN
INTRAMUSCULAR | Status: AC
  Filled 2014-12-17: qty 1

## 2014-12-17 MED ORDER — PROPOFOL INFUSION 10 MG/ML OPTIME
INTRAVENOUS | Status: DC | PRN
  Administered 2014-12-17: 50 ug/kg/min via INTRAVENOUS
  Administered 2014-12-17 (×2): via INTRAVENOUS

## 2014-12-17 MED ORDER — THROMBIN 20000 UNITS EX SOLR
CUTANEOUS | Status: AC
  Filled 2014-12-17: qty 20000

## 2014-12-17 MED ORDER — LIDOCAINE HCL (CARDIAC) 20 MG/ML IV SOLN
INTRAVENOUS | Status: DC | PRN
  Administered 2014-12-17: 60 mg via INTRAVENOUS
  Administered 2014-12-17: 100 mg via INTRATRACHEAL

## 2014-12-17 MED ORDER — METHYLPREDNISOLONE ACETATE 40 MG/ML IJ SUSP
INTRAMUSCULAR | Status: AC
  Filled 2014-12-17: qty 1

## 2014-12-17 MED ORDER — METHYLPREDNISOLONE ACETATE 40 MG/ML IJ SUSP
INTRAMUSCULAR | Status: DC | PRN
  Administered 2014-12-17: 40 mg

## 2014-12-17 MED ORDER — METHYLENE BLUE 1 % INJ SOLN
INTRAMUSCULAR | Status: AC
  Filled 2014-12-17: qty 10

## 2014-12-17 MED ORDER — ACETAMINOPHEN 10 MG/ML IV SOLN
INTRAVENOUS | Status: DC | PRN
  Administered 2014-12-17: 1000 mg via INTRAVENOUS

## 2014-12-17 MED ORDER — BUPIVACAINE-EPINEPHRINE 0.25% -1:200000 IJ SOLN
INTRAMUSCULAR | Status: DC | PRN
Start: 2014-12-17 — End: 2014-12-17
  Administered 2014-12-17: 10 mL
  Administered 2014-12-17: 5 mL

## 2014-12-17 MED ORDER — ESMOLOL HCL 10 MG/ML IV SOLN
INTRAVENOUS | Status: AC
  Filled 2014-12-17: qty 10

## 2014-12-17 MED ORDER — VECURONIUM BROMIDE 10 MG IV SOLR
INTRAVENOUS | Status: AC
  Filled 2014-12-17: qty 10

## 2014-12-17 MED ORDER — ALBUMIN HUMAN 5 % IV SOLN
INTRAVENOUS | Status: DC | PRN
  Administered 2014-12-17: 16:00:00 via INTRAVENOUS

## 2014-12-17 MED ORDER — ONDANSETRON HCL 4 MG/2ML IJ SOLN
INTRAMUSCULAR | Status: DC | PRN
  Administered 2014-12-17: 4 mg via INTRAVENOUS

## 2014-12-17 MED ORDER — SUCCINYLCHOLINE CHLORIDE 20 MG/ML IJ SOLN
INTRAMUSCULAR | Status: DC | PRN
  Administered 2014-12-17: 120 mg via INTRAVENOUS

## 2014-12-17 MED ORDER — 0.9 % SODIUM CHLORIDE (POUR BTL) OPTIME
TOPICAL | Status: DC | PRN
  Administered 2014-12-17 (×2): 1000 mL

## 2014-12-17 MED ORDER — DEXAMETHASONE SODIUM PHOSPHATE 4 MG/ML IJ SOLN
INTRAMUSCULAR | Status: DC | PRN
  Administered 2014-12-17: 4 mg via INTRAVENOUS

## 2014-12-17 MED ORDER — POVIDONE-IODINE 7.5 % EX SOLN
Freq: Once | CUTANEOUS | Status: DC
  Filled 2014-12-17: qty 118

## 2014-12-17 MED ORDER — NEOSTIGMINE METHYLSULFATE 10 MG/10ML IV SOLN
INTRAVENOUS | Status: AC
  Filled 2014-12-17: qty 1

## 2014-12-17 MED ORDER — HYDROMORPHONE HCL 1 MG/ML IJ SOLN
INTRAMUSCULAR | Status: AC
  Filled 2014-12-17: qty 1

## 2014-12-17 MED ORDER — MIDAZOLAM HCL 2 MG/2ML IJ SOLN
INTRAMUSCULAR | Status: AC
  Filled 2014-12-17: qty 2

## 2014-12-17 MED ORDER — STERILE WATER FOR INJECTION IJ SOLN
INTRAMUSCULAR | Status: AC
  Filled 2014-12-17: qty 10

## 2014-12-17 MED ORDER — BUPIVACAINE-EPINEPHRINE (PF) 0.25% -1:200000 IJ SOLN
INTRAMUSCULAR | Status: AC
  Filled 2014-12-17: qty 30

## 2014-12-17 MED ORDER — THROMBIN 20000 UNITS EX SOLR
CUTANEOUS | Status: DC | PRN
  Administered 2014-12-17: 20 mL via TOPICAL

## 2014-12-17 MED ORDER — ONDANSETRON HCL 4 MG/2ML IJ SOLN
INTRAMUSCULAR | Status: AC
  Filled 2014-12-17: qty 2

## 2014-12-17 MED ORDER — MIDAZOLAM HCL 5 MG/5ML IJ SOLN
INTRAMUSCULAR | Status: DC | PRN
  Administered 2014-12-17: 2 mg via INTRAVENOUS

## 2014-12-17 MED ORDER — ARTIFICIAL TEARS OP OINT
TOPICAL_OINTMENT | OPHTHALMIC | Status: DC | PRN
  Administered 2014-12-17: 1 via OPHTHALMIC

## 2014-12-17 MED ORDER — LACTATED RINGERS IV SOLN
INTRAVENOUS | Status: DC
  Administered 2014-12-17: 11:00:00 via INTRAVENOUS

## 2014-12-17 MED ORDER — VECURONIUM BROMIDE 10 MG IV SOLR
INTRAVENOUS | Status: DC | PRN
  Administered 2014-12-17: 4 mg via INTRAVENOUS
  Administered 2014-12-17: 1 mg via INTRAVENOUS

## 2014-12-17 MED ORDER — PROPOFOL 10 MG/ML IV BOLUS
INTRAVENOUS | Status: DC | PRN
  Administered 2014-12-17: 200 mg via INTRAVENOUS

## 2014-12-17 MED ORDER — MEPERIDINE HCL 25 MG/ML IJ SOLN: 6.2500 mg | INTRAMUSCULAR | Status: DC | PRN

## 2014-12-17 MED ORDER — NEOSTIGMINE METHYLSULFATE 10 MG/10ML IV SOLN
INTRAVENOUS | Status: DC | PRN
  Administered 2014-12-17: 3 mg via INTRAVENOUS

## 2014-12-17 MED ORDER — ONDANSETRON HCL 4 MG/2ML IJ SOLN: 4.0000 mg | Freq: Once | INTRAMUSCULAR | Status: DC | PRN

## 2014-12-17 MED ORDER — HYDROMORPHONE HCL 1 MG/ML IJ SOLN
0.2500 mg | INTRAMUSCULAR | Status: DC | PRN
  Administered 2014-12-17: 0.5 mg via INTRAVENOUS

## 2014-12-17 MED ORDER — GLYCOPYRROLATE 0.2 MG/ML IJ SOLN
INTRAMUSCULAR | Status: AC
  Filled 2014-12-17: qty 1

## 2014-12-17 MED ORDER — GLYCOPYRROLATE 0.2 MG/ML IJ SOLN
INTRAMUSCULAR | Status: DC | PRN
  Administered 2014-12-17: 0.4 mg via INTRAVENOUS

## 2014-12-17 MED ORDER — ACETAMINOPHEN 10 MG/ML IV SOLN
INTRAVENOUS | Status: AC
  Filled 2014-12-17: qty 100

## 2014-12-17 MED ORDER — GLYCOPYRROLATE 0.2 MG/ML IJ SOLN
INTRAMUSCULAR | Status: AC
  Filled 2014-12-17: qty 2

## 2014-12-17 MED ORDER — LIDOCAINE HCL (CARDIAC) 20 MG/ML IV SOLN
INTRAVENOUS | Status: AC
  Filled 2014-12-17: qty 5

## 2014-12-17 MED ORDER — FENTANYL CITRATE 0.05 MG/ML IJ SOLN
INTRAMUSCULAR | Status: DC | PRN
  Administered 2014-12-17 (×2): 50 ug via INTRAVENOUS
  Administered 2014-12-17: 100 ug via INTRAVENOUS
  Administered 2014-12-17: 50 ug via INTRAVENOUS
  Administered 2014-12-17: 100 ug via INTRAVENOUS
  Administered 2014-12-17 (×2): 50 ug via INTRAVENOUS

## 2014-12-17 MED ORDER — LACTATED RINGERS IV SOLN
INTRAVENOUS | Status: DC | PRN
  Administered 2014-12-17 (×3): via INTRAVENOUS

## 2014-12-17 SURGICAL SUPPLY — 74 items
BENZOIN TINCTURE PRP APPL 2/3 (GAUZE/BANDAGES/DRESSINGS) ×3 IMPLANT
BUR ROUND PRECISION 4.0 (BURR) ×2 IMPLANT
BUR ROUND PRECISION 4.0MM (BURR) ×1
CANISTER SUCTION 2500CC (MISCELLANEOUS) ×3 IMPLANT
CARTRIDGE OIL MAESTRO DRILL (MISCELLANEOUS) ×1 IMPLANT
CLOSURE STERI-STRIP 1/2X4 (GAUZE/BANDAGES/DRESSINGS) ×1
CLOSURE WOUND 1/2 X4 (GAUZE/BANDAGES/DRESSINGS)
CLSR STERI-STRIP ANTIMIC 1/2X4 (GAUZE/BANDAGES/DRESSINGS) ×2 IMPLANT
CORDS BIPOLAR (ELECTRODE) IMPLANT
COVER SURGICAL LIGHT HANDLE (MISCELLANEOUS) ×3 IMPLANT
DIFFUSER DRILL AIR PNEUMATIC (MISCELLANEOUS) ×3 IMPLANT
DRAIN CHANNEL 15F RND FF W/TCR (WOUND CARE) IMPLANT
DRAPE POUCH INSTRU U-SHP 10X18 (DRAPES) ×3 IMPLANT
DRAPE SURG 17X23 STRL (DRAPES) ×12 IMPLANT
DURAPREP 26ML APPLICATOR (WOUND CARE) ×3 IMPLANT
ELECT BLADE 4.0 EZ CLEAN MEGAD (MISCELLANEOUS)
ELECT CAUTERY BLADE 6.4 (BLADE) ×3 IMPLANT
ELECT REM PT RETURN 9FT ADLT (ELECTROSURGICAL) ×3
ELECTRODE BLDE 4.0 EZ CLN MEGD (MISCELLANEOUS) IMPLANT
ELECTRODE REM PT RTRN 9FT ADLT (ELECTROSURGICAL) ×1 IMPLANT
EVACUATOR SILICONE 100CC (DRAIN) IMPLANT
FILTER STRAW FLUID ASPIR (MISCELLANEOUS) IMPLANT
GAUZE SPONGE 4X4 12PLY STRL (GAUZE/BANDAGES/DRESSINGS) ×3 IMPLANT
GAUZE SPONGE 4X4 16PLY XRAY LF (GAUZE/BANDAGES/DRESSINGS) ×6 IMPLANT
GLOVE BIO SURGEON STRL SZ7 (GLOVE) ×3 IMPLANT
GLOVE BIO SURGEON STRL SZ8 (GLOVE) ×3 IMPLANT
GLOVE BIOGEL PI IND STRL 7.0 (GLOVE) ×1 IMPLANT
GLOVE BIOGEL PI IND STRL 8 (GLOVE) ×1 IMPLANT
GLOVE BIOGEL PI INDICATOR 7.0 (GLOVE) ×2
GLOVE BIOGEL PI INDICATOR 8 (GLOVE) ×2
GOWN STRL REUS W/ TWL LRG LVL3 (GOWN DISPOSABLE) ×2 IMPLANT
GOWN STRL REUS W/ TWL XL LVL3 (GOWN DISPOSABLE) ×1 IMPLANT
GOWN STRL REUS W/TWL LRG LVL3 (GOWN DISPOSABLE) ×4
GOWN STRL REUS W/TWL XL LVL3 (GOWN DISPOSABLE) ×2
IV CATH 14GX2 1/4 (CATHETERS) ×3 IMPLANT
KIT BASIN OR (CUSTOM PROCEDURE TRAY) ×3 IMPLANT
KIT POSITION SURG JACKSON T1 (MISCELLANEOUS) ×3 IMPLANT
KIT ROOM TURNOVER OR (KITS) ×3 IMPLANT
NEEDLE 18GX1X1/2 (RX/OR ONLY) (NEEDLE) ×3 IMPLANT
NEEDLE 22X1 1/2 (OR ONLY) (NEEDLE) ×3 IMPLANT
NEEDLE HYPO 25GX1X1/2 BEV (NEEDLE) ×3 IMPLANT
NEEDLE SPNL 18GX3.5 QUINCKE PK (NEEDLE) ×6 IMPLANT
NEURO MONITORING STIM (LABOR (TRAVEL & OVERTIME)) ×3 IMPLANT
NS IRRIG 1000ML POUR BTL (IV SOLUTION) ×3 IMPLANT
OIL CARTRIDGE MAESTRO DRILL (MISCELLANEOUS) ×3
PACK LAMINECTOMY ORTHO (CUSTOM PROCEDURE TRAY) ×3 IMPLANT
PACK UNIVERSAL I (CUSTOM PROCEDURE TRAY) ×3 IMPLANT
PAD ARMBOARD 7.5X6 YLW CONV (MISCELLANEOUS) ×6 IMPLANT
PATTIES SURGICAL .5 X.5 (GAUZE/BANDAGES/DRESSINGS) IMPLANT
PATTIES SURGICAL .5 X1 (DISPOSABLE) ×3 IMPLANT
SPONGE GAUZE 4X4 12PLY STER LF (GAUZE/BANDAGES/DRESSINGS) ×3 IMPLANT
SPONGE INTESTINAL PEANUT (DISPOSABLE) ×3 IMPLANT
SPONGE SURGIFOAM ABS GEL 100 (HEMOSTASIS) ×3 IMPLANT
SPONGE SURGIFOAM ABS GEL SZ50 (HEMOSTASIS) ×3 IMPLANT
STRIP CLOSURE SKIN 1/2X4 (GAUZE/BANDAGES/DRESSINGS) IMPLANT
SURGIFLO TRUKIT (HEMOSTASIS) IMPLANT
SUT MNCRL AB 4-0 PS2 18 (SUTURE) ×3 IMPLANT
SUT VIC AB 0 CT1 18XCR BRD 8 (SUTURE) ×1 IMPLANT
SUT VIC AB 0 CT1 27 (SUTURE) ×2
SUT VIC AB 0 CT1 27XBRD ANBCTR (SUTURE) ×1 IMPLANT
SUT VIC AB 0 CT1 8-18 (SUTURE) ×2
SUT VIC AB 1 CT1 18XCR BRD 8 (SUTURE) ×1 IMPLANT
SUT VIC AB 1 CT1 8-18 (SUTURE) ×2
SUT VIC AB 2-0 CT2 18 VCP726D (SUTURE) ×3 IMPLANT
SYR 20CC LL (SYRINGE) IMPLANT
SYR BULB IRRIGATION 50ML (SYRINGE) IMPLANT
SYR CONTROL 10ML LL (SYRINGE) ×6 IMPLANT
SYR TB 1ML 26GX3/8 SAFETY (SYRINGE) ×3 IMPLANT
SYR TB 1ML LUER SLIP (SYRINGE) IMPLANT
TAPE CLOTH SURG 6X10 WHT LF (GAUZE/BANDAGES/DRESSINGS) ×3 IMPLANT
TOWEL OR 17X24 6PK STRL BLUE (TOWEL DISPOSABLE) ×3 IMPLANT
TOWEL OR 17X26 10 PK STRL BLUE (TOWEL DISPOSABLE) ×3 IMPLANT
WATER STERILE IRR 1000ML POUR (IV SOLUTION) IMPLANT
YANKAUER SUCT BULB TIP NO VENT (SUCTIONS) ×3 IMPLANT

## 2014-12-17 NOTE — Progress Notes (Signed)
Straight Cath following procedure urine out.

## 2014-12-17 NOTE — Discharge Instructions (Signed)
What to eat: ° °For your first meals, you should eat lightly; only small meals initially.  If you do not have nausea, you may eat larger meals.  Avoid spicy, greasy and heavy food.   ° °General Anesthesia, Adult, Care After  °Refer to this sheet in the next few weeks. These instructions provide you with information on caring for yourself after your procedure. Your health care provider may also give you more specific instructions. Your treatment has been planned according to current medical practices, but problems sometimes occur. Call your health care provider if you have any problems or questions after your procedure.  °WHAT TO EXPECT AFTER THE PROCEDURE  °After the procedure, it is typical to experience:  °Sleepiness.  °Nausea and vomiting. °HOME CARE INSTRUCTIONS  °For the first 24 hours after general anesthesia:  °Have a responsible person with you.  °Do not drive a car. If you are alone, do not take public transportation.  °Do not drink alcohol.  °Do not take medicine that has not been prescribed by your health care provider.  °Do not sign important papers or make important decisions.  °You may resume a normal diet and activities as directed by your health care provider.  °Change bandages (dressings) as directed.  °If you have questions or problems that seem related to general anesthesia, call the hospital and ask for the anesthetist or anesthesiologist on call. °SEEK MEDICAL CARE IF:  °You have nausea and vomiting that continue the day after anesthesia.  °You develop a rash. °SEEK IMMEDIATE MEDICAL CARE IF:  °You have difficulty breathing.  °You have chest pain.  °You have any allergic problems. °Document Released: 01/29/2001 Document Revised: 06/25/2013 Document Reviewed: 05/08/2013  °ExitCare® Patient Information ©2014 [employer].  ° °Sore Throat  ° ° °A sore throat is a painful, burning, sore, or scratchy feeling of the throat. There may be pain or tenderness when swallowing or talking. You may have  other symptoms with a sore throat. These include coughing, sneezing, fever, or a swollen neck. A sore throat is often the first sign of another sickness. These sicknesses may include a cold, flu, strep throat, or an infection called mono. Most sore throats go away without medical treatment.  °HOME CARE  °Only take medicine as told by your doctor.  °Drink enough fluids to keep your pee (urine) clear or pale yellow.  °Rest as needed.  °Try using throat sprays, lozenges, or suck on hard candy (if older than 4 years or as told).  °Sip warm liquids, such as broth, herbal tea, or warm water with honey. Try sucking on frozen ice pops or drinking cold liquids.  °Rinse the mouth (gargle) with salt water. Mix 1 teaspoon salt with 8 ounces of water.  °Do not smoke. Avoid being around others when they are smoking.  °Put a humidifier in your bedroom at night to moisten the air. You can also turn on a hot shower and sit in the bathroom for 5-10 minutes. Be sure the bathroom door is closed. °GET HELP RIGHT AWAY IF:  °You have trouble breathing.  °You cannot swallow fluids, soft foods, or your spit (saliva).  °You have more puffiness (swelling) in the throat.  °Your sore throat does not get better in 7 days.  °You feel sick to your stomach (nauseous) and throw up (vomit).  °You have a fever or lasting symptoms for more than 2-3 days.  °You have a fever and your symptoms suddenly get worse. °MAKE SURE YOU:  °Understand these   instructions.  Will watch your condition.  Will get help right away if you are not doing well or get worse. Document Released: 08/01/2008 Document Revised: 07/17/2012 Document Reviewed: 06/30/2012  [facility] Patient Information 2015 [place], [place]. This information is not intended to replace advice given to you by your health care provider. Make sure you discuss any questions you have with your health care provider.     Laminectomy - Laminotomy - Discectomy Your surgeon has decided that a laminectomy  (entire lamina removal) or laminotomy (partial lamina removal) is the best treatment for your back problem. These procedures involve removal of bone to relieve pressure on nerve roots. It allows the surgeon access to parts of the spine where other problems are located. This could be an injured disc (the cartilage-like structures located between the bones of the back). In this surgery your surgeon removes a part of the boney arch that surrounds your spinal canal. This may be compressing nerve roots. In some cases, the surgeon will remove the disc and fuse (stick together) vertebral bodies (the bones of your back) to make the spine more stable. The type of procedure you will need is usually decided prior to surgery, however modifications may be necessary. The time in surgery depends on the findings in surgery and the procedure necessary to correct the problems. DISCECTOMY For people with disc problems, the surgeon removes the portion of the disc that is causing the pressure on the nerve root. Some surgeons perform a micro (small) discectomy, which may require removal of only a small portion of the lamina. A disc nucleus (center) may also be removed either through a needle (percutaneous discectomy) or by injecting an enzyme called chymopapain into the disc. Chymopapain is an enzyme that dissolves the disc. For people with back instability, the surgeon fuses vertebrae that are next to each other with tiny pieces of bone. These are used as bone grafts on the facets, or between the vertebrae. When this heals, the bones will no longer be able to move. These bone chips are often taken from the pelvic bones. Bones and bone grafts grow into one unit, stabilizing the segments of the spinal column. LET YOUR CAREGIVER KNOW ABOUT:  Allergies.  Medicines taken including herbs, eyedrops, over-the-counter medicines, and creams.  Use of steroids (by mouth or creams).  Previous problems with anesthetics or numbing  medicine.  Possibility of pregnancy, if this applies.  History of blood clots (thrombophlebitis).  History of bleeding or blood problems.  Previous surgery.  Other health problems. RISKS AND COMPLICATIONS Your caregiver will discuss possible risks and complications with you before surgery. In addition to the usual risks of anesthesia, other common risks and complications include:  Blood loss and replacement.  Temporary increase in pain due to surgery.  Uncorrected back pain.  Infection.  New nerve damage (tingling, numbness, and pain). BEFORE THE PROCEDURE  Stop smoking at least 1 week prior to surgery. This lowers risk during surgery.  Your caregiver may advise that you stop taking certain medicines that may affect the outcome of the surgery and your ability to heal. For example, you may need to stop taking anti-inflammatories, such as aspirin, because of possible bleeding problems. Other medicines may have interactions with anesthesia.  Tell your caregiver if you have been on steroids for long periods of time. Often, additional steroids are administered intravenously before and during the procedure to prevent complications.  You should be present 60 minutes prior to your procedure or as directed. AFTER THE PROCEDURE  After surgery, you will be taken to the recovery area where a nurse will watch and check your progress. Generally, you will be allowed to go home within 1 week barring other problems. HOME CARE INSTRUCTIONS   Check the surgical cut (incision) twice a day for signs of infection. Some signs may include a bad smelling, greenish or yellowish discharge from the wound; increased pain or increased redness over the incision site; an opening of the incision; flu-like symptoms; or a temperature above 101.5 F (38.6 C).  Change your bandages in about 24 to 36 hours following surgery or as directed.  You may shower once the bandage is removed or as directed. Avoid bathtubs,  swimming pools, and hot tubs for 3 weeks or until your incision has healed completely. If you have stitches (sutures) or staples they may be removed 2 to 3 weeks after surgery, or as directed by your caregiver.  Follow your caregiver's instructions for activities, exercises, and physical therapy.  Weight reduction may be beneficial if you are overweight.  Walking is permitted. You may use a treadmill without an incline. Cut down on activities if you have discomfort. You may also go up and down stairs as tolerated.  Do not lift anything heavier than 10 to 15 pounds. Avoid bending or twisting at the waist. Always bend your knees.  Maintain strength and range of motion as instructed.  No driving is permitted for 2 to 3 weeks, or as directed by your caregiver. You may be a passenger for 20 to 30 minute trips. Lying back in the passenger seat may be more comfortable for you.  Limit your sitting to 20 to 30 minute intervals. You should lie down or walk in between sitting periods. There are no limitations for sitting in a recliner chair.  Only take over-the-counter or prescription medicines for pain, discomfort, or fever as directed by your caregiver. SEEK MEDICAL CARE IF:   There is increased bleeding (more than a small spot) from the wound.  You notice redness, swelling, or increasing pain in the wound.  Pus is coming from the wound.  An unexplained oral temperature above 102 F (38.9 C) develops.  You notice a bad smell coming from the wound or dressing. SEEK IMMEDIATE MEDICAL CARE IF:   You develop a rash.  You have difficulty breathing.  You have any allergic problems. Document Released: 10/20/2000 Document Revised: 03/09/2014 Document Reviewed: 08/18/2008 [facility] Patient Information 2015 [place], [place]. This information is not intended to replace advice given to you by your health care provider. Make sure you discuss any questions you have with your health care provider.

## 2014-12-17 NOTE — Anesthesia Procedure Notes (Signed)
Procedure Name: Intubation Date/Time: 12/17/2014 12:50 PM Performed by: Jenne Campus Pre-anesthesia Checklist: Patient identified, Emergency Drugs available, Suction available, Patient being monitored and Timeout performed Patient Re-evaluated:Patient Re-evaluated prior to inductionOxygen Delivery Method: Circle system utilized Preoxygenation: Pre-oxygenation with 100% oxygen Intubation Type: IV induction Ventilation: Oral airway inserted - appropriate to patient size and Two handed mask ventilation required Laryngoscope Size: Miller and 3 Grade View: Grade I Tube type: Oral Tube size: 7.5 mm Number of attempts: 1 Airway Equipment and Method: Stylet and LTA kit utilized Placement Confirmation: ETT inserted through vocal cords under direct vision,  positive ETCO2,  CO2 detector and breath sounds checked- equal and bilateral Secured at: 22 cm Tube secured with: Tape Dental Injury: Teeth and Oropharynx as per pre-operative assessment

## 2014-12-17 NOTE — Transfer of Care (Signed)
Immediate Anesthesia Transfer of Care Note  Patient: [name] SentersWilliam M Shepherd  Procedure(s) Performed: Procedure(s) with comments: LUMBAR LAMINECTOMY/DECOMPRESSION MICRODISCECTOMY (N/A) - Lumbar 4-5 decompression  Patient Location: PACU  Anesthesia Type:General  Level of Consciousness: awake, alert  and oriented  Airway & Oxygen Therapy: Patient Spontanous Breathing and Patient connected to nasal cannula oxygen  Post-op Assessment: Report given to RN and Post -op Vital signs reviewed and stable  Post vital signs: Reviewed and stable  Last Vitals:  Filed Vitals:   12/17/14 1733  BP: 122/51  Pulse:   Temp: 36.9 C  Resp:     Complications: No apparent anesthesia complications

## 2014-12-17 NOTE — Anesthesia Postprocedure Evaluation (Signed)
  Anesthesia Post-op Note  Patient: [name] SentersWilliam M Shepherd  Procedure(s) Performed: Procedure(s) with comments: LUMBAR LAMINECTOMY/DECOMPRESSION MICRODISCECTOMY (N/A) - Lumbar 4-5 decompression  Patient Location: PACU  Anesthesia Type:General  Level of Consciousness: awake and alert   Airway and Oxygen Therapy: Patient Spontanous Breathing  Post-op Pain: mild  Post-op Assessment: Post-op Vital signs reviewed, Patient's Cardiovascular Status Stable and Respiratory Function Stable  Post-op Vital Signs: Reviewed  Filed Vitals:   12/17/14 1800  BP:   Pulse: 105  Temp:   Resp: 18    Complications: No apparent anesthesia complications

## 2014-12-17 NOTE — Anesthesia Preprocedure Evaluation (Addendum)
Anesthesia Evaluation  Patient identified by MRN, date of birth, ID band Patient awake    Reviewed: Allergy & Precautions, NPO status , Patient's Chart, lab work & pertinent test results  Airway Mallampati: II  TM Distance: >3 FB Neck ROM: Full    Dental  (+) Teeth Intact, Dental Advisory Given   Pulmonary former smoker,  breath sounds clear to auscultation        Cardiovascular Rhythm:Regular Rate:Normal     Neuro/Psych negative neurological ROS     GI/Hepatic Neg liver ROS, GERD-  ,  Endo/Other  negative endocrine ROS  Renal/GU negative Renal ROS     Musculoskeletal   Abdominal   Peds  Hematology negative hematology ROS (+)   Anesthesia Other Findings   Reproductive/Obstetrics negative OB ROS                            Anesthesia Physical Anesthesia Plan  ASA: II  Anesthesia Plan: General   Post-op Pain Management:    Induction: Intravenous  Airway Management Planned: Oral ETT  Additional Equipment:   Intra-op Plan:   Post-operative Plan: Extubation in OR  Informed Consent: I have reviewed the patients History and Physical, chart, labs and discussed the procedure including the risks, benefits and alternatives for the proposed anesthesia with the patient or authorized representative who has indicated his/her understanding and acceptance.     Plan Discussed with: CRNA and Surgeon  Anesthesia Plan Comments:         Anesthesia Quick Evaluation

## 2014-12-18 ENCOUNTER — Encounter (HOSPITAL_COMMUNITY): Payer: Self-pay | Admitting: Orthopedic Surgery

## 2014-12-18 NOTE — Progress Notes (Signed)
Patient c/o numbness/rubberyness in arms, advised to call Dr. Yevette Edwardsumonski

## 2014-12-18 NOTE — Anesthesia Postprocedure Evaluation (Signed)
Anesthesia Post Note  Patient: [name] SentersWilliam M Shepherd  Procedure(s) Performed: Procedure(s) (LRB): LUMBAR LAMINECTOMY/DECOMPRESSION MICRODISCECTOMY (N/A)  Anesthesia type: general  Patient location: PACU  Post pain: Pain level controlled  Post assessment: Patient's Cardiovascular Status Stable  Last Vitals:  Filed Vitals:   12/17/14 1847  BP: 122/69  Pulse: 88  Temp:   Resp: 18    Post vital signs: Reviewed and stable  Level of consciousness: sedated  Complications: No apparent anesthesia complications

## 2014-12-21 NOTE — Op Note (Signed)
NAME:  [name] Shepherd, [name]              ACCOUNT NO.:  [id number]  MEDICAL RECORD NO.:  [id number]  LOCATION:                                FACILITY:  [facility]  PHYSICIAN:  Estill BambergMark Evynn Boutelle, MD      DATE OF BIRTH:  09-28-1961  DATE OF PROCEDURE:  12/17/2014 DATE OF DISCHARGE:  12/17/2014                              OPERATIVE REPORT   PREOPERATIVE DIAGNOSIS:  Right-sided L4-L5 foraminal disk herniation causing compression of the right L4 nerve.  POSTOPERATIVE DIAGNOSIS:  Right-sided L4-L5 foraminal disk herniation causing compression of the right L4 nerve.  PROCEDURE:  Transpedicular decompression using a Wiltse approach on the right, L4-L5.  SURGEON:  Estill BambergMark Lempi Edwin, MD.  ASSISTANJason Coop:  Kayla McKenzie, PA-C.  ANESTHESIA:  General endotracheal anesthesia.  COMPLICATIONS:  None.  DISPOSITION:  Stable.  ESTIMATED BLOOD LOSS:  Minimal.  INDICATIONS FOR SURGERY:  Briefly, [name] Shepherd is a very pleasant 54 year old male, who did present to me with severe pain in his right leg.  The patient states that the pain increased over the last few months, however, he did have pain predating the increasing pain for 3 months prior.  An MRI did reveal a foraminal disk herniation on the right.  The patient did fail multiple forms of conservative care and did continue to have ongoing and rather debilitating pain.  Given this, we did discuss proceeding with the procedure as noted above.  The patient did fully understand the risks and limitations of the procedure.  Of particular note, the patient did understand the risk of recurrent disk herniation as well as lack of resolution of pain.  OPERATIVE DETAILS:  On December 17, 2014, the patient was brought to Surgery and general endotracheal anesthesia was administered.  The patient was placed prone on a well-padded flat Jackson bed with a spinal frame.  Antibiotics were given, and a time-out procedure was performed. The patient was placed prone on a  well-padded flat Jackson bed with a spinal frame.  The back was prepped and draped and a right-sided paramedian incision was made approximately 2.5 cm lateral to the midline.  The fascia was incised.  A Wiltse approach was used.  The transverse processes of L4 and L5 were identified and subperiosteally exposed, as was the lateral aspect of the lamina of L4.  I did remove the inner transverse membrane.  The exiting L4 nerve was identified and noted to be under tension.  I was able to gently mobilize the nerve superiorly and laterally.  Of note, there was a prominent protrusion noted immediately ventral to the nerve.  Of note, the protrusion itself did span the foraminal and extraforaminal region, and was noted to slightly involve the lateral recess as well.  I did use a reverse angled Epstein curette multiple times to displace multiple disk fragments into the intervertebral space, after which point they were removed using a series of pituitary rongeurs.  At the termination of the decompression, I was able to confirm adequate decompression of the exiting L4 nerve. The wound was then copiously irrigated.  Bleeding was controlled using Gelfoam as well as electrocautery.  Also of note, I did use neurologic monitoring throughout the surgery, and  there was no sustained abnormal EMG activity noted throughout the entire surgery.  I then introduced 40 mg of Depo-Medrol about the region of the exiting L4 nerve.  The fascia was then closed using #1 Vicryl.  The subcutaneous layer was closed using 2-0 Vicryl, and the skin was closed using 3-0 Monocryl.  Benzoin and Steri-Strips were applied followed by a sterile dressing.  All instrument counts were correct at the termination of the procedure.  Of note, Georjean Mode was my assistant throughout the surgery; and did aid in retraction, suctioning, and closure.     Estill Bamberg, MD     MD/MEDQ  D:  12/17/2014  T:  12/18/2014  Job:  [id number]

## 2016-12-15 DIAGNOSIS — K1321 Leukoplakia of oral mucosa, including tongue: Secondary | ICD-10-CM | POA: Diagnosis not present

## 2016-12-20 DIAGNOSIS — K1329 Other disturbances of oral epithelium, including tongue: Secondary | ICD-10-CM | POA: Diagnosis not present

## 2017-03-12 DIAGNOSIS — L989 Disorder of the skin and subcutaneous tissue, unspecified: Secondary | ICD-10-CM | POA: Diagnosis not present

## 2017-06-14 DIAGNOSIS — Z125 Encounter for screening for malignant neoplasm of prostate: Secondary | ICD-10-CM | POA: Diagnosis not present

## 2017-06-14 DIAGNOSIS — E7800 Pure hypercholesterolemia, unspecified: Secondary | ICD-10-CM | POA: Diagnosis not present

## 2017-06-14 DIAGNOSIS — B079 Viral wart, unspecified: Secondary | ICD-10-CM | POA: Diagnosis not present

## 2017-06-14 DIAGNOSIS — Z0000 Encounter for general adult medical examination without abnormal findings: Secondary | ICD-10-CM | POA: Diagnosis not present

## 2017-06-14 DIAGNOSIS — Z131 Encounter for screening for diabetes mellitus: Secondary | ICD-10-CM | POA: Diagnosis not present

## 2017-09-08 DIAGNOSIS — Z23 Encounter for immunization: Secondary | ICD-10-CM | POA: Diagnosis not present

## 2018-01-21 DIAGNOSIS — D229 Melanocytic nevi, unspecified: Secondary | ICD-10-CM | POA: Diagnosis not present

## 2018-01-21 DIAGNOSIS — D485 Neoplasm of uncertain behavior of skin: Secondary | ICD-10-CM | POA: Diagnosis not present

## 2018-01-31 ENCOUNTER — Other Ambulatory Visit: Payer: Self-pay | Admitting: Podiatry

## 2018-01-31 ENCOUNTER — Ambulatory Visit (INDEPENDENT_AMBULATORY_CARE_PROVIDER_SITE_OTHER): Payer: BLUE CROSS/BLUE SHIELD

## 2018-01-31 ENCOUNTER — Encounter: Payer: Self-pay | Admitting: Podiatry

## 2018-01-31 ENCOUNTER — Ambulatory Visit: Payer: BLUE CROSS/BLUE SHIELD | Admitting: Podiatry

## 2018-01-31 VITALS — BP 122/72 | HR 64 | Resp 16

## 2018-01-31 DIAGNOSIS — M2042 Other hammer toe(s) (acquired), left foot: Secondary | ICD-10-CM

## 2018-01-31 DIAGNOSIS — M79672 Pain in left foot: Secondary | ICD-10-CM

## 2018-01-31 DIAGNOSIS — L723 Sebaceous cyst: Secondary | ICD-10-CM

## 2018-01-31 NOTE — Progress Notes (Signed)
Subjective:   Patient ID: [name] Shepherd, male   DOB: 57 y.o.   MRN: [id number]   HPI Patient presents with a irritative cyst on the left third toe approximate 9 months duration.  States it gets very irritated and at times fluid will leak from it and he has tried to pad it and wear wider shoes.  Patient does not smoke and likes to be active   Review of Systems  All other systems reviewed and are negative.       Objective:  Physical Exam  Constitutional: He appears well-developed and well-nourished.  Cardiovascular: Intact distal pulses.  Pulmonary/Chest: Effort normal.  Musculoskeletal: Normal range of motion.  Neurological: He is alert.  Skin: Skin is warm.  Nursing note and vitals reviewed.   Neurovascular status intact muscle strength is adequate range of motion within normal limits with patient found to have a area of irritation of the third digit distal left lateral side with fluid leakage coming from here and also hypertrophy of the head of the middle phalanx digit 3 left with discomfort upon palpation.  Patient is noted to have good digital perfusion and is well oriented x3     Assessment:  Combination of mucoid cyst third digit left with a hypertrophy of the bone structure with possible arthritis of the distal interphalangeal joint digit 3 left     Plan:  H&P x-ray reviewed with patient.  I discussed condition at great length and discussed alternatives and at this point I do think distal arthroplasty with removal of ganglionic cyst primary would be of best long-term for him.  Patient wants this done and wants to read consent form today and I allowed him to read it going over alternative treatments complications.  Patient is willing to accept risk understands recovery will take several months and swelling of the toe can last longer than that.  Patient at this time signed consent form is given all preoperative instructions will have date schedule and is encouraged to call with  any questions prior to procedure  X-ray indicates that there is some narrowing of the distal interphalangeal joint digit 3 left especially on the medial side

## 2018-01-31 NOTE — Patient Instructions (Signed)
Pre-Operative Instructions  Congratulations, you have decided to take an important step towards improving your quality of life.  You can be assured that the doctors and staff at [facility] will be with you every step of the way.  Here are some important things you should know:  1. Plan to be at the surgery center/hospital at least 1 (one) hour prior to your scheduled time, unless otherwise directed by the surgical center/hospital staff.  You must have a responsible adult accompany you, remain during the surgery and drive you home.  Make sure you have directions to the surgical center/hospital to ensure you arrive on time. 2. If you are having surgery at [facility] or [facility] hospitals, you will need a copy of your medical history and physical form from your family physician within one month prior to the date of surgery. We will give you a form for your primary physician to complete.  3. We make every effort to accommodate the date you request for surgery.  However, there are times where surgery dates or times have to be moved.  We will contact you as soon as possible if a change in schedule is required.   4. No aspirin/ibuprofen for one week before surgery.  If you are on aspirin, any non-steroidal anti-inflammatory medications (Mobic, Aleve, Ibuprofen) should not be taken seven (7) days prior to your surgery.  You make take Tylenol for pain prior to surgery.  5. Medications - If you are taking daily heart and blood pressure medications, seizure, reflux, allergy, asthma, anxiety, pain or diabetes medications, make sure you notify the surgery center/hospital before the day of surgery so they can tell you which medications you should take or avoid the day of surgery. 6. No food or drink after midnight the night before surgery unless directed otherwise by surgical center/hospital staff. 7. No alcoholic beverages 24-hours prior to surgery.  No smoking 24-hours prior or 24-hours after  surgery. 8. Wear loose pants or shorts. They should be loose enough to fit over bandages, boots, and casts. 9. Don't wear slip-on shoes. Sneakers are preferred. 10. Bring your boot with you to the surgery center/hospital.  Also bring crutches or a walker if your physician has prescribed it for you.  If you do not have this equipment, it will be provided for you after surgery. 11. If you have not been contacted by the surgery center/hospital by the day before your surgery, call to confirm the date and time of your surgery. 12. Leave-time from work may vary depending on the type of surgery you have.  Appropriate arrangements should be made prior to surgery with your employer. 13. Prescriptions will be provided immediately following surgery by your doctor.  Fill these as soon as possible after surgery and take the medication as directed. Pain medications will not be refilled on weekends and must be approved by the doctor. 14. Remove nail polish on the operative foot and avoid getting pedicures prior to surgery. 15. Wash the night before surgery.  The night before surgery wash the foot and leg well with water and the antibacterial soap provided. Be sure to pay special attention to beneath the toenails and in between the toes.  Wash for at least three (3) minutes. Rinse thoroughly with water and dry well with a towel.  Perform this wash unless told not to do so by your physician.  Enclosed: 1 Ice pack (please put in freezer the night before surgery)   1 Hibiclens skin cleaner     Pre-op instructions  If you have any questions regarding the instructions, please do not hesitate to call our office.  [place]: [address], [place], [place] [address] -- [phone]  [place]: [address] [place], [place] [address] -- [phone]  [place]: [address]  [place], [place] [address] -- [phone]  [place]: [address], [address], [place], [place] [address] -- [phone]  Website:  [email or web address] 

## 2018-02-08 ENCOUNTER — Telehealth: Payer: Self-pay | Admitting: *Deleted

## 2018-02-08 NOTE — Telephone Encounter (Signed)
"  I need to schedule my surgery."  Do you have a date in mind?  Dr. Charlsie Merlesegal does surgery on Tuesdays.  "I'd like to do it around the second week of June."  He can do it on June 11.  "That date will be great!  What time will I need to be there?"  Someone from the surgical center will call you a day or two prior to your surgery date.  They will give you your arrival time.

## 2018-04-03 ENCOUNTER — Telehealth: Payer: Self-pay | Admitting: *Deleted

## 2018-04-03 NOTE — Telephone Encounter (Signed)
"  I have a surgery scheduled for Tuesday the 11th.  I'm going to have to cancel that and rescheduled another date.  I've got to be out of town that week.  If you could, give me a call back.  We can confirm the cancellation and I'll have to look at my calendar for a reschedule.  I also need to make sure, do I need to contact or will you contact the surgical center?  Thank you."

## 2018-04-04 NOTE — Telephone Encounter (Signed)
I attempted to return his call.  I left him a message to call me back to reschedule his appointment.

## 2018-04-05 NOTE — Telephone Encounter (Signed)
I cancelled pt's post op appointments to reflect that he cancelled his surgery.

## 2018-04-05 NOTE — Telephone Encounter (Signed)
I got your message about canceling your surgery for June 11.  Do you want to reschedule it?  "No, not at this time.  I don't know what my travel plans are.  I am working on a project that has me traveling in and out of the country."  Well give us a call if you need any conservative treatment or if you'd like to set up another date for surgery.  "Okay, I'll keep that in mind."

## 2018-04-24 ENCOUNTER — Other Ambulatory Visit: Payer: BLUE CROSS/BLUE SHIELD

## 2018-05-08 ENCOUNTER — Encounter: Payer: BLUE CROSS/BLUE SHIELD | Admitting: Podiatry

## 2018-09-06 DIAGNOSIS — M79676 Pain in unspecified toe(s): Secondary | ICD-10-CM | POA: Diagnosis not present

## 2018-09-20 DIAGNOSIS — M79676 Pain in unspecified toe(s): Secondary | ICD-10-CM | POA: Diagnosis not present

## 2018-10-07 DIAGNOSIS — Z1159 Encounter for screening for other viral diseases: Secondary | ICD-10-CM | POA: Diagnosis not present

## 2018-10-07 DIAGNOSIS — E7800 Pure hypercholesterolemia, unspecified: Secondary | ICD-10-CM | POA: Diagnosis not present

## 2018-10-07 DIAGNOSIS — Z0000 Encounter for general adult medical examination without abnormal findings: Secondary | ICD-10-CM | POA: Diagnosis not present

## 2018-10-07 DIAGNOSIS — Z125 Encounter for screening for malignant neoplasm of prostate: Secondary | ICD-10-CM | POA: Diagnosis not present

## 2019-04-18 DIAGNOSIS — E785 Hyperlipidemia, unspecified: Secondary | ICD-10-CM | POA: Diagnosis not present

## 2019-10-17 DIAGNOSIS — Z0000 Encounter for general adult medical examination without abnormal findings: Secondary | ICD-10-CM | POA: Diagnosis not present

## 2019-10-24 DIAGNOSIS — E785 Hyperlipidemia, unspecified: Secondary | ICD-10-CM | POA: Diagnosis not present

## 2019-10-24 DIAGNOSIS — M109 Gout, unspecified: Secondary | ICD-10-CM | POA: Diagnosis not present

## 2020-01-13 DIAGNOSIS — Z1159 Encounter for screening for other viral diseases: Secondary | ICD-10-CM | POA: Diagnosis not present

## 2020-01-16 DIAGNOSIS — D123 Benign neoplasm of transverse colon: Secondary | ICD-10-CM | POA: Diagnosis not present

## 2020-01-16 DIAGNOSIS — Z8371 Family history of colonic polyps: Secondary | ICD-10-CM | POA: Diagnosis not present

## 2020-02-02 ENCOUNTER — Ambulatory Visit: Payer: Self-pay | Attending: Internal Medicine

## 2020-02-02 DIAGNOSIS — Z23 Encounter for immunization: Secondary | ICD-10-CM

## 2020-02-02 NOTE — Progress Notes (Signed)
   Covid-19 Vaccination Clinic  Name:  [name] Shepherd    MRN: [id number] DOB: 02/03/1961  02/02/2020  [name] Shepherd was observed post Covid-19 immunization for 15 minutes without incident. He was provided with Vaccine Information Sheet and instruction to access the V-Safe system.   [name] Shepherd was instructed to call 911 with any severe reactions post vaccine: Marland Kitchen Difficulty breathing  . Swelling of face and throat  . A fast heartbeat  . A bad rash all over body  . Dizziness and weakness   Immunizations Administered    Name Date Dose VIS Date Route   Pfizer COVID-19 Vaccine 02/02/2020  1:49 PM 0.3 mL 10/17/2019 Intramuscular   Manufacturer: [employer], [employer]   Lot: [id number]   NDC: [address]-5

## 2020-02-25 ENCOUNTER — Ambulatory Visit: Payer: Self-pay | Attending: Internal Medicine

## 2020-02-25 DIAGNOSIS — Z23 Encounter for immunization: Secondary | ICD-10-CM

## 2020-02-25 NOTE — Progress Notes (Signed)
   Covid-19 Vaccination Clinic  Name:  ROGEN PORTE    MRN: [id number] DOB: Aug 13, 1961  02/25/2020  Mr. Cleckler was observed post Covid-19 immunization for 15 minutes without incident. He was provided with Vaccine Information Sheet and instruction to access the V-Safe system.   Mr. Burciaga was instructed to call 911 with any severe reactions post vaccine: Marland Kitchen Difficulty breathing  . Swelling of face and throat  . A fast heartbeat  . A bad rash all over body  . Dizziness and weakness   Immunizations Administered    Name Date Dose VIS Date Route   Pfizer COVID-19 Vaccine 02/25/2020  9:29 AM 0.3 mL 12/31/2018 Intramuscular   Manufacturer: [employer], [employer]   Lot: [id number]   NDC: [address]-7

## 2020-05-21 DIAGNOSIS — E785 Hyperlipidemia, unspecified: Secondary | ICD-10-CM | POA: Diagnosis not present

## 2020-05-21 DIAGNOSIS — Z23 Encounter for immunization: Secondary | ICD-10-CM | POA: Diagnosis not present

## 2020-10-28 DIAGNOSIS — E785 Hyperlipidemia, unspecified: Secondary | ICD-10-CM | POA: Diagnosis not present

## 2020-10-28 DIAGNOSIS — Z125 Encounter for screening for malignant neoplasm of prostate: Secondary | ICD-10-CM | POA: Diagnosis not present

## 2020-10-28 DIAGNOSIS — M109 Gout, unspecified: Secondary | ICD-10-CM | POA: Diagnosis not present

## 2020-10-28 DIAGNOSIS — Z0000 Encounter for general adult medical examination without abnormal findings: Secondary | ICD-10-CM | POA: Diagnosis not present

## 2021-11-18 DIAGNOSIS — M109 Gout, unspecified: Secondary | ICD-10-CM | POA: Diagnosis not present

## 2021-11-18 DIAGNOSIS — Z0000 Encounter for general adult medical examination without abnormal findings: Secondary | ICD-10-CM | POA: Diagnosis not present

## 2021-11-18 DIAGNOSIS — E785 Hyperlipidemia, unspecified: Secondary | ICD-10-CM | POA: Diagnosis not present

## 2021-11-18 DIAGNOSIS — Z23 Encounter for immunization: Secondary | ICD-10-CM | POA: Diagnosis not present

## 2022-01-17 DIAGNOSIS — Z23 Encounter for immunization: Secondary | ICD-10-CM | POA: Diagnosis not present

## 2022-08-21 DIAGNOSIS — D229 Melanocytic nevi, unspecified: Secondary | ICD-10-CM | POA: Diagnosis not present

## 2022-08-21 DIAGNOSIS — L814 Other melanin hyperpigmentation: Secondary | ICD-10-CM | POA: Diagnosis not present

## 2022-08-21 DIAGNOSIS — L578 Other skin changes due to chronic exposure to nonionizing radiation: Secondary | ICD-10-CM | POA: Diagnosis not present

## 2022-08-21 DIAGNOSIS — L821 Other seborrheic keratosis: Secondary | ICD-10-CM | POA: Diagnosis not present

## 2022-09-06 DIAGNOSIS — R110 Nausea: Secondary | ICD-10-CM | POA: Diagnosis not present

## 2022-09-06 DIAGNOSIS — I10 Essential (primary) hypertension: Secondary | ICD-10-CM | POA: Diagnosis not present

## 2022-09-06 DIAGNOSIS — R42 Dizziness and giddiness: Secondary | ICD-10-CM | POA: Diagnosis not present

## 2022-09-06 DIAGNOSIS — R531 Weakness: Secondary | ICD-10-CM | POA: Diagnosis not present

## 2022-09-07 DIAGNOSIS — R059 Cough, unspecified: Secondary | ICD-10-CM | POA: Diagnosis not present

## 2022-09-07 DIAGNOSIS — R42 Dizziness and giddiness: Secondary | ICD-10-CM | POA: Diagnosis not present

## 2022-11-04 DIAGNOSIS — R059 Cough, unspecified: Secondary | ICD-10-CM | POA: Diagnosis not present

## 2022-11-29 DIAGNOSIS — Z125 Encounter for screening for malignant neoplasm of prostate: Secondary | ICD-10-CM | POA: Diagnosis not present

## 2022-11-29 DIAGNOSIS — Z0000 Encounter for general adult medical examination without abnormal findings: Secondary | ICD-10-CM | POA: Diagnosis not present

## 2022-11-29 DIAGNOSIS — Z13220 Encounter for screening for lipoid disorders: Secondary | ICD-10-CM | POA: Diagnosis not present

## 2022-11-29 DIAGNOSIS — Z6828 Body mass index (BMI) 28.0-28.9, adult: Secondary | ICD-10-CM | POA: Diagnosis not present

## 2022-12-01 ENCOUNTER — Other Ambulatory Visit (HOSPITAL_BASED_OUTPATIENT_CLINIC_OR_DEPARTMENT_OTHER): Payer: Self-pay | Admitting: Family Medicine

## 2022-12-01 ENCOUNTER — Ambulatory Visit (HOSPITAL_BASED_OUTPATIENT_CLINIC_OR_DEPARTMENT_OTHER)
Admission: RE | Admit: 2022-12-01 | Discharge: 2022-12-01 | Disposition: A | Discharge status: Home / Self Care | Payer: BC Managed Care – PPO | Source: Ambulatory Visit | Attending: Family Medicine | Admitting: Family Medicine

## 2022-12-01 DIAGNOSIS — R053 Chronic cough: Secondary | ICD-10-CM

## 2022-12-01 DIAGNOSIS — R059 Cough, unspecified: Secondary | ICD-10-CM | POA: Diagnosis not present

## 2022-12-15 DIAGNOSIS — M10071 Idiopathic gout, right ankle and foot: Secondary | ICD-10-CM | POA: Diagnosis not present

## 2022-12-19 DIAGNOSIS — M109 Gout, unspecified: Secondary | ICD-10-CM | POA: Diagnosis not present

## 2023-07-19 DIAGNOSIS — J069 Acute upper respiratory infection, unspecified: Secondary | ICD-10-CM | POA: Diagnosis not present

## 2023-07-19 DIAGNOSIS — R053 Chronic cough: Secondary | ICD-10-CM | POA: Diagnosis not present

## 2023-07-27 DIAGNOSIS — M25521 Pain in right elbow: Secondary | ICD-10-CM | POA: Diagnosis not present

## 2023-07-27 DIAGNOSIS — M25511 Pain in right shoulder: Secondary | ICD-10-CM | POA: Diagnosis not present

## 2023-07-31 DIAGNOSIS — M79621 Pain in right upper arm: Secondary | ICD-10-CM | POA: Diagnosis not present

## 2023-12-04 DIAGNOSIS — J309 Allergic rhinitis, unspecified: Secondary | ICD-10-CM | POA: Diagnosis not present

## 2023-12-04 DIAGNOSIS — E785 Hyperlipidemia, unspecified: Secondary | ICD-10-CM | POA: Diagnosis not present

## 2023-12-04 DIAGNOSIS — M109 Gout, unspecified: Secondary | ICD-10-CM | POA: Diagnosis not present

## 2023-12-04 DIAGNOSIS — R030 Elevated blood-pressure reading, without diagnosis of hypertension: Secondary | ICD-10-CM | POA: Diagnosis not present

## 2023-12-04 DIAGNOSIS — Z0000 Encounter for general adult medical examination without abnormal findings: Secondary | ICD-10-CM | POA: Diagnosis not present

## 2023-12-04 DIAGNOSIS — Z125 Encounter for screening for malignant neoplasm of prostate: Secondary | ICD-10-CM | POA: Diagnosis not present
